# Patient Record
Sex: Male | Born: 1970 | Race: White | Hispanic: No | Marital: Single | Smoking: Current every day smoker
Health system: Southern US, Community
[De-identification: ages and names within clinical notes are randomized; demographics above are authoritative.]

## PROBLEM LIST (undated history)

## (undated) DIAGNOSIS — F329 Major depressive disorder, single episode, unspecified: Secondary | ICD-10-CM

## (undated) DIAGNOSIS — M549 Dorsalgia, unspecified: Secondary | ICD-10-CM

## (undated) DIAGNOSIS — G8929 Other chronic pain: Secondary | ICD-10-CM

## (undated) DIAGNOSIS — F1910 Other psychoactive substance abuse, uncomplicated: Secondary | ICD-10-CM

## (undated) DIAGNOSIS — F32A Depression, unspecified: Secondary | ICD-10-CM

## (undated) HISTORY — PX: CARDIAC SURGERY: SHX584

## (undated) NOTE — *Deleted (*Deleted)
ED Peer Support Specialist Patient Intake (Complete at intake & 30-60 Day Follow-up)  Name: Todd Horn  MRN: [id number]  Age: 37 y.o.   Date of Admission: 03/14/2020  Intake: Initial Comments:      Primary Reason Admitted: Drug Overdose   Lab values: Alcohol/ETOH: Not completed Positive UDS? Drug screen not completed Amphetamines: Drug screen not completed Barbiturates: Drug screen not completed Benzodiazepines: Drug screen not completed Cocaine: Drug screen not completed Opiates: Drug screen not completed Cannabinoids: Drug screen not completed  Demographic information: Gender: Male Ethnicity: Unknown Marital Status: Single Insurance Status: Uninsured/Self-pay Control and instrumentation engineer (Work Engineer, agricultural, Sales executive, etc.: No Lives with: Partner/Spouse Living situation: House/Apartment  Reported Patient History: Patient reported health conditions: Depression Patient aware of HIV and hepatitis status: No  In past year, has patient visited ED for any reason? No  Number of ED visits:    Reason(s) for visit:    In past year, has patient been hospitalized for any reason? No  Number of hospitalizations:    Reason(s) for hospitalization:    In past year, has patient been arrested? No  Number of arrests:    Reason(s) for arrest:    In past year, has patient been incarcerated? Yes  Number of incarcerations:    Reason(s) for incarceration: forgot to go to court  In past year, has patient received medication-assisted treatment? No  In past year, patient received the following treatments: Non-residential community treatment  In past year, has patient received any harm reduction services? Yes  Did this include any of the following? Purchasing syringes from a pharmacy, Making contact with an SEP  In past year, has patient received care from a mental health provider for diagnosis other than SUD? No  In past year, is this first time patient has  overdosed? No  Number of past overdoses:    In past year, is this first time patient has been hospitalized for an overdose? No  Number of hospitalizations for overdose(s):    Is patient currently receiving treatment for a mental health diagnosis? No  Patient reports experiencing difficulty participating in SUD treatment: No    Most important reason(s) for this difficulty?    Has patient received prior services for treatment? No  In past, patient has received services from following agencies:    Plan of Care:  Suggested follow up at these agencies/treatment centers: Other (comment)  Other information: ***   Merlinda Frederick, CPSS  03/14/2020 12:13 PM

---

## 2013-04-17 ENCOUNTER — Encounter (HOSPITAL_COMMUNITY): Payer: Self-pay | Admitting: Emergency Medicine

## 2013-04-17 ENCOUNTER — Emergency Department (HOSPITAL_COMMUNITY)
Admission: EM | Admit: 2013-04-17 | Discharge: 2013-04-17 | Disposition: A | Discharge status: Home / Self Care | Payer: Self-pay | Attending: Emergency Medicine | Admitting: Emergency Medicine

## 2013-04-17 DIAGNOSIS — IMO0002 Reserved for concepts with insufficient information to code with codable children: Secondary | ICD-10-CM | POA: Insufficient documentation

## 2013-04-17 DIAGNOSIS — N509 Disorder of male genital organs, unspecified: Secondary | ICD-10-CM | POA: Insufficient documentation

## 2013-04-17 DIAGNOSIS — M216X9 Other acquired deformities of unspecified foot: Secondary | ICD-10-CM | POA: Insufficient documentation

## 2013-04-17 DIAGNOSIS — G8929 Other chronic pain: Secondary | ICD-10-CM | POA: Insufficient documentation

## 2013-04-17 DIAGNOSIS — Z9889 Other specified postprocedural states: Secondary | ICD-10-CM | POA: Insufficient documentation

## 2013-04-17 DIAGNOSIS — Y929 Unspecified place or not applicable: Secondary | ICD-10-CM | POA: Insufficient documentation

## 2013-04-17 DIAGNOSIS — X500XXA Overexertion from strenuous movement or load, initial encounter: Secondary | ICD-10-CM | POA: Insufficient documentation

## 2013-04-17 DIAGNOSIS — Y9389 Activity, other specified: Secondary | ICD-10-CM | POA: Insufficient documentation

## 2013-04-17 DIAGNOSIS — M545 Low back pain: Secondary | ICD-10-CM

## 2013-04-17 HISTORY — DX: Dorsalgia, unspecified: M54.9

## 2013-04-17 HISTORY — DX: Other chronic pain: G89.29

## 2013-04-17 LAB — URINALYSIS, ROUTINE W REFLEX MICROSCOPIC
Bilirubin Urine: NEGATIVE
Glucose, UA: NEGATIVE mg/dL
Protein, ur: NEGATIVE mg/dL
Specific Gravity, Urine: 1.011 (ref 1.005–1.030)
Urobilinogen, UA: 0.2 mg/dL (ref 0.0–1.0)

## 2013-04-17 LAB — URINE MICROSCOPIC-ADD ON

## 2013-04-17 MED ORDER — KETOROLAC TROMETHAMINE 30 MG/ML IJ SOLN
30.0000 mg | Freq: Once | INTRAMUSCULAR | Status: AC
  Administered 2013-04-17: 30 mg via INTRAMUSCULAR
  Filled 2013-04-17: qty 1

## 2013-04-17 MED ORDER — HYDROCODONE-ACETAMINOPHEN 5-325 MG PO TABS: 1.0000 | ORAL_TABLET | ORAL | Status: DC | PRN

## 2013-04-17 MED ORDER — CYCLOBENZAPRINE HCL 5 MG PO TABS: 5.0000 mg | ORAL_TABLET | Freq: Three times a day (TID) | ORAL | Status: DC | PRN

## 2013-04-17 MED ORDER — DEXAMETHASONE SODIUM PHOSPHATE 10 MG/ML IJ SOLN
10.0000 mg | Freq: Once | INTRAMUSCULAR | Status: AC
  Administered 2013-04-17: 10 mg via INTRAMUSCULAR
  Filled 2013-04-17: qty 1

## 2013-04-17 NOTE — ED Notes (Signed)
Patient reports that he was lifting furniture and other things moving his parents into a new home. Patient states he has problems lifting his left foot . Patient denies any numbness.

## 2013-04-17 NOTE — ED Provider Notes (Signed)
CSN: [id number]     Arrival date & time 04/17/13  1642 History   This chart was scribed for non-physician practitioner Luiz Iron, PA-C working with Celene Kras, MD by Caryn Bee, ED Scribe. This patient was seen in room WTR6/WTR6 and the patient's care was started at 7:04 PM.    Chief Complaint  Patient presents with  . Back Pain   HPI  HPI Comments: Todd Horn is a 42 y.o. male who presents to the Emergency Department complaining of constant gradual onset left sided back pain that began 2 nights ago after moving furniture for his parents. He states that he moved the furniture and had not pain.  That night he gradually starting getting lower back pain, which was worse by the next morning.  Pt denies any back injury/trauma.  He states he has h/[place] of two back [place]out of state[place] He states that the current back pain is the same as his previous back pain. Pain is worsened when standing from sitting position. He has taken 4 800 mg ibuprofen daily and 2 narcotic pills for pain with mild relief. Pt reports numbness to his left calf. He denies loss of sensation, abdominal pain, dysuria, penile discharge, chest pain, SOB, neck pain, or fevers. Pt states that after he urinates he feels like he has finished but still has urine that drips out.  No loss of bowel/bladder function.  He describes the left testicle pain as "I feel like I've been kicked". Pt has been able to ambulate as usual. Pt denies h/o cancer. He reports an intermittent drop in his left foot since the incident.  He reports occasional tripping due to this.  He states that he had similar problems with his foot after his back surgeries in the past.      Past Medical History  Diagnosis Date  . Chronic back pain    Past Surgical History  Procedure Laterality Date  . Cardiac surgery     History reviewed. No pertinent family history. History  Substance Use Topics  . Smoking status: Never Smoker   . Smokeless tobacco:  Never Used  . Alcohol Use: Yes     Comment: occasionally    Review of Systems  Constitutional: Negative for fever, chills, diaphoresis, activity change, appetite change and fatigue.  HENT: Negative for congestion, rhinorrhea and sore throat.   Respiratory: Negative for cough and shortness of breath.   Cardiovascular: Negative for chest pain and leg swelling.  Gastrointestinal: Negative for nausea, vomiting, abdominal pain, diarrhea and constipation.  Genitourinary: Positive for testicular pain. Negative for dysuria, urgency, hematuria, discharge, penile swelling, scrotal swelling, difficulty urinating and penile pain.  Musculoskeletal: Positive for back pain. Negative for gait problem and neck pain.  Skin: Negative for color change and wound.  Neurological: Positive for numbness. Negative for dizziness, weakness, light-headedness and headaches.  All other systems reviewed and are negative.    Allergies  Review of patient's allergies indicates no known allergies.  Home Medications   Current Outpatient Rx  Name  Route  Sig  Dispense  Refill  . albuterol (PROVENTIL HFA;VENTOLIN HFA) 108 (90 BASE) MCG/ACT inhaler   Inhalation   Inhale 2 puffs into the lungs every 6 (six) hours as needed for wheezing or shortness of breath.         Marland Kitchen ibuprofen (ADVIL,MOTRIN) 200 MG tablet   Oral   Take 800 mg by mouth every 6 (six) hours as needed.  BP 159/91  Pulse 106  Temp(Src) 98.4 F (36.9 C) (Oral)  Resp 20  SpO2 98%  Filed Vitals:   04/17/13 1648 04/17/13 2038  BP: 159/91 120/77  Pulse: 106 92  Temp: 98.4 F (36.9 C) 98.9 F (37.2 C)  TempSrc: Oral Oral  Resp: 20 14  SpO2: 98% 99%     Physical Exam  Nursing note and vitals reviewed. Constitutional: He is oriented to person, place, and time. He appears well-developed and well-nourished. No distress.  HENT:  Head: Normocephalic and atraumatic.  Right Ear: External ear normal.  Left Ear: External ear normal.   Nose: Nose normal.  Mouth/Throat: Oropharynx is clear and moist. No oropharyngeal exudate.  Eyes: Conjunctivae and EOM are normal. Right eye exhibits no discharge. Left eye exhibits no discharge.  Neck: Normal range of motion. Neck supple. No tracheal deviation present.  Cardiovascular: Normal rate, regular rhythm, normal heart sounds and intact distal pulses.  Exam reveals no gallop and no friction rub.   No murmur heard. Dorsalis pedis pulses present and equal bilaterally  Pulmonary/Chest: Effort normal and breath sounds normal. No respiratory distress. He has no wheezes. He has no rales. He exhibits no tenderness.  Abdominal: Soft. Bowel sounds are normal. He exhibits no distension and no mass. There is no tenderness. There is no rebound and no guarding.  Genitourinary:  No testicular tenderness bilaterally.  No inguinal hernia bilaterally.  Musculoskeletal: Normal range of motion. He exhibits edema and tenderness.       Back:  Tenderness to palpation to the left lower lumbar region.  Pain increased with back flexion and extension. No thoracic or lumbar spinal tenderness.  Positive straight leg raise on the left.  Patient able to ambulate on his toes and heels as well as on his feet without difficulty or ataxia.  Strength 5/5 in the upper and lower extremities bilaterally.  Trace pitting edema equal bilaterally.        Neurological: He is alert and oriented to person, place, and time. He has normal reflexes.  Sensation intact in the LE bilaterally.  Patient reports focal numbness to the left lateral calf.  Patellar reflexes are intact bilaterally  Skin: Skin is warm and dry. He is not diaphoretic.  No erythema, ecchymosis or lacerations throughout  Psychiatric: He has a normal mood and affect. His behavior is normal.    ED Course  Procedures (including critical care time) DIAGNOSTIC STUDIES: Oxygen Saturation is 98% on room air, normal by my interpretation.    COORDINATION OF  CARE: 7:15 PM-Discussed treatment plan with pt at bedside and pt agreed to plan.   Labs Review Labs Reviewed - No data to display Imaging Review No results found.  EKG Interpretation   None      Results for orders placed during the hospital encounter of 04/17/13  URINE CULTURE      Result Value Range   Specimen Description URINE, CLEAN CATCH     Special Requests NONE     Culture  Setup Time       Value: 04/18/2013 00:58     Performed at [facility] Count       Value: NO GROWTH     Performed at [facility]   Culture       Value: NO GROWTH     Performed at [facility]   Report Status 04/19/2013 FINAL    URINALYSIS, ROUTINE W REFLEX MICROSCOPIC      Result Value  Range   Color, Urine YELLOW  YELLOW   APPearance CLOUDY (*) CLEAR   Specific Gravity, Urine 1.011  1.005 - 1.030   pH 6.0  5.0 - 8.0   Glucose, UA NEGATIVE  NEGATIVE mg/dL   Hgb urine dipstick NEGATIVE  NEGATIVE   Bilirubin Urine NEGATIVE  NEGATIVE   Ketones, ur NEGATIVE  NEGATIVE mg/dL   Protein, ur NEGATIVE  NEGATIVE mg/dL   Urobilinogen, UA 0.2  0.0 - 1.0 mg/dL   Nitrite NEGATIVE  NEGATIVE   Leukocytes, UA SMALL (*) NEGATIVE  URINE MICROSCOPIC-ADD ON      Result Value Range   Squamous Epithelial / LPF RARE  RARE   WBC, UA 0-2  <3 WBC/hpf    MDM   Todd Horn is a 42 y.o. male who presents to the Emergency Department complaining of constant gradual onset left sided back pain that began 2 nights ago after moving furniture for his parents   Back pain likely musculoskeletal in nature.  Possibly a muscle strain vs his chronic back pain exacerbated by moving furniture.  No injuries/trauma.  Urine not highly suggestive for a UTI.  Will be sent for culture.  Patient given Decadron and Toradol in the ED which improved his pain.  He was able to ambulate without difficulty.  Patient neurovascularly intact.  He has an appointment with a PCP on Friday.  Return precautions were  discussed. Patient given prescription for flexeril and Vicodin.  He states that Naprosyn has not worked for him in the past.  He has been using his mother's Vicodin which has been working for him.  He was given precautions regarding these medications.  Return precautions were discussed.  Patient in agreement with discharge and plan.    Discharge Medication List as of 04/17/2013  8:32 PM    START taking these medications   Details  cyclobenzaprine (FLEXERIL) 5 MG tablet Take 1 tablet (5 mg total) by mouth 3 (three) times daily as needed for muscle spasms., Starting 04/17/2013, Until Discontinued, Print    HYDROcodone-acetaminophen (NORCO/VICODIN) 5-325 MG per tablet Take 1 tablet by mouth every 4 (four) hours as needed., Starting 04/17/2013, Until Discontinued, Print        Final impressions: 1. Low back pain radiating to left leg      Luiz Iron PA-C   This patient was discussed with Dr. Lynelle Doctor   I personally performed the services described in this documentation, which was scribed in my presence. The recorded information has been reviewed and is accurate.      Jillyn Ledger, PA-C 04/19/13 1329

## 2013-04-18 ENCOUNTER — Telehealth (HOSPITAL_COMMUNITY): Payer: Self-pay | Admitting: Emergency Medicine

## 2013-04-19 LAB — URINE CULTURE

## 2013-04-24 NOTE — ED Provider Notes (Signed)
Medical screening examination/treatment/procedure(s) were performed by non-physician practitioner and as supervising physician I was immediately available for consultation/collaboration.   Adolf Ormiston R Madelynn Malson, MD 04/24/13 1027 

## 2013-05-16 ENCOUNTER — Ambulatory Visit: Payer: Self-pay | Admitting: Family Medicine

## 2013-05-16 VITALS — BP 116/82 | HR 100 | Temp 98.9°F | Resp 16 | Ht 71.0 in | Wt 231.0 lb

## 2013-05-16 DIAGNOSIS — R300 Dysuria: Secondary | ICD-10-CM

## 2013-05-16 DIAGNOSIS — M5416 Radiculopathy, lumbar region: Secondary | ICD-10-CM

## 2013-05-16 DIAGNOSIS — Z765 Malingerer [conscious simulation]: Secondary | ICD-10-CM

## 2013-05-16 DIAGNOSIS — F1120 Opioid dependence, uncomplicated: Secondary | ICD-10-CM

## 2013-05-16 DIAGNOSIS — IMO0002 Reserved for concepts with insufficient information to code with codable children: Secondary | ICD-10-CM

## 2013-05-16 DIAGNOSIS — F1910 Other psychoactive substance abuse, uncomplicated: Secondary | ICD-10-CM

## 2013-05-16 LAB — POCT URINALYSIS DIPSTICK
Bilirubin, UA: NEGATIVE
Glucose, UA: NEGATIVE
Ketones, UA: NEGATIVE
Leukocytes, UA: NEGATIVE
Nitrite, UA: NEGATIVE
PH UA: 5.5
PROTEIN UA: NEGATIVE
RBC UA: NEGATIVE
SPEC GRAV UA: 1.02
UROBILINOGEN UA: 0.2
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
## 2013-05-16 NOTE — Patient Instructions (Signed)
[facility] accepts Men (18+). [phone]  [facility] [phone]  [facility] [phone]  You can contact one of the above numbers and see if they can get you in. It is absolutely necessary that you do this.

## 2013-09-27 ENCOUNTER — Encounter (HOSPITAL_COMMUNITY): Payer: Self-pay | Admitting: Emergency Medicine

## 2013-09-27 ENCOUNTER — Emergency Department (HOSPITAL_COMMUNITY)
Admission: EM | Admit: 2013-09-27 | Discharge: 2013-09-27 | Disposition: A | Discharge status: Home / Self Care | Payer: Self-pay | Attending: Emergency Medicine | Admitting: Emergency Medicine

## 2013-09-27 DIAGNOSIS — R61 Generalized hyperhidrosis: Secondary | ICD-10-CM | POA: Insufficient documentation

## 2013-09-27 DIAGNOSIS — F1120 Opioid dependence, uncomplicated: Secondary | ICD-10-CM | POA: Insufficient documentation

## 2013-09-27 DIAGNOSIS — T4271XA Poisoning by unspecified antiepileptic and sedative-hypnotic drugs, accidental (unintentional), initial encounter: Secondary | ICD-10-CM | POA: Insufficient documentation

## 2013-09-27 DIAGNOSIS — Y9389 Activity, other specified: Secondary | ICD-10-CM | POA: Insufficient documentation

## 2013-09-27 DIAGNOSIS — F3289 Other specified depressive episodes: Secondary | ICD-10-CM | POA: Insufficient documentation

## 2013-09-27 DIAGNOSIS — Y9289 Other specified places as the place of occurrence of the external cause: Secondary | ICD-10-CM | POA: Insufficient documentation

## 2013-09-27 DIAGNOSIS — F329 Major depressive disorder, single episode, unspecified: Secondary | ICD-10-CM | POA: Insufficient documentation

## 2013-09-27 DIAGNOSIS — F17200 Nicotine dependence, unspecified, uncomplicated: Secondary | ICD-10-CM | POA: Insufficient documentation

## 2013-09-27 DIAGNOSIS — R404 Transient alteration of awareness: Secondary | ICD-10-CM | POA: Insufficient documentation

## 2013-09-27 DIAGNOSIS — R1110 Vomiting, unspecified: Secondary | ICD-10-CM | POA: Insufficient documentation

## 2013-09-27 DIAGNOSIS — G8929 Other chronic pain: Secondary | ICD-10-CM | POA: Insufficient documentation

## 2013-09-27 DIAGNOSIS — T40601A Poisoning by unspecified narcotics, accidental (unintentional), initial encounter: Secondary | ICD-10-CM

## 2013-09-27 DIAGNOSIS — Z79899 Other long term (current) drug therapy: Secondary | ICD-10-CM | POA: Insufficient documentation

## 2013-09-27 HISTORY — DX: Depression, unspecified: F32.A

## 2013-09-27 HISTORY — DX: Other psychoactive substance abuse, uncomplicated: F19.10

## 2013-09-27 HISTORY — DX: Major depressive disorder, single episode, unspecified: F32.9

## 2013-09-27 LAB — CBC WITH DIFFERENTIAL/PLATELET
Basophils Absolute: 0 10*3/uL (ref 0.0–0.1)
Basophils Relative: 0 % (ref 0–1)
EOS ABS: 0.1 10*3/uL (ref 0.0–0.7)
EOS PCT: 1 % (ref 0–5)
HCT: 41.3 % (ref 39.0–52.0)
Hemoglobin: 14.4 g/dL (ref 13.0–17.0)
Lymphocytes Relative: 17 % (ref 12–46)
Lymphs Abs: 1.9 10*3/uL (ref 0.7–4.0)
MCH: 30.5 pg (ref 26.0–34.0)
MCHC: 34.9 g/dL (ref 30.0–36.0)
MCV: 87.5 fL (ref 78.0–100.0)
Monocytes Absolute: 0.7 10*3/uL (ref 0.1–1.0)
Monocytes Relative: 6 % (ref 3–12)
Neutro Abs: 9 10*3/uL — ABNORMAL HIGH (ref 1.7–7.7)
Neutrophils Relative %: 76 % (ref 43–77)
PLATELETS: 244 10*3/uL (ref 150–400)
RBC: 4.72 MIL/uL (ref 4.22–5.81)
RDW: 12.6 % (ref 11.5–15.5)
WBC: 11.8 10*3/uL — ABNORMAL HIGH (ref 4.0–10.5)

## 2013-09-27 LAB — RAPID URINE DRUG SCREEN, HOSP PERFORMED
AMPHETAMINES: POSITIVE — AB
BENZODIAZEPINES: NOT DETECTED
Barbiturates: NOT DETECTED
COCAINE: NOT DETECTED
Opiates: POSITIVE — AB
Tetrahydrocannabinol: NOT DETECTED

## 2013-09-27 LAB — COMPREHENSIVE METABOLIC PANEL
ALT: 39 U/L (ref 0–53)
AST: 35 U/L (ref 0–37)
Albumin: 4.4 g/dL (ref 3.5–5.2)
Alkaline Phosphatase: 90 U/L (ref 39–117)
BUN: 17 mg/dL (ref 6–23)
CALCIUM: 9.5 mg/dL (ref 8.4–10.5)
CO2: 25 mEq/L (ref 19–32)
CREATININE: 1.14 mg/dL (ref 0.50–1.35)
Chloride: 98 mEq/L (ref 96–112)
GFR calc non Af Amer: 77 mL/min — ABNORMAL LOW (ref >90)
GFR, EST AFRICAN AMERICAN: 89 mL/min — AB (ref >90)
GLUCOSE: 186 mg/dL — AB (ref 70–99)
Potassium: 4 mEq/L (ref 3.7–5.3)
SODIUM: 138 meq/L (ref 137–147)
Total Bilirubin: 0.3 mg/dL (ref 0.3–1.2)
Total Protein: 7.6 g/dL (ref 6.0–8.3)

## 2013-09-27 LAB — ETHANOL: Alcohol, Ethyl (B): <11 mg/dL (ref 0–11)

## 2013-09-27 MED ORDER — ONDANSETRON HCL 4 MG/2ML IJ SOLN
4.0000 mg | Freq: Once | INTRAMUSCULAR | Status: AC
  Administered 2013-09-27: 4 mg via INTRAVENOUS
  Filled 2013-09-27: qty 2

## 2013-09-27 NOTE — Progress Notes (Signed)
[facility] provided pt with a list of primary care resources, ACA information, and a [facility] Orange Card application to help patient establish primary care.  °

## 2013-09-27 NOTE — ED Notes (Signed)
Per EMS- Patient had been clean since 2010. Patient lost his job 4 days ago and used heroin IV today at home. Patient was unresponsive upon arrival. Narcan 2 mg given intranasally. Patient now alert and oriented x 4. Patient c/o slight nausea.

## 2013-09-27 NOTE — Discharge Instructions (Signed)
Narcotic Overdose  A narcotic overdose is the misuse or overuse of a narcotic drug. A narcotic overdose can make you pass out and stop breathing. If you are not treated right away, this can cause permanent brain damage or stop your heart. Medicine may be given to reverse the effects of an overdose. If so, this medicine may bring on withdrawal symptoms. The symptoms may be abdominal cramps, throwing up (vomiting), sweating, chills, and nervousness.  Injecting narcotics can cause more problems than just an overdose. AIDS, hepatitis, and other very serious infections are transmitted by sharing needles and syringes. If you decide to quit using, there are medicines which can help you through the withdrawal period. Trying to quit all at once on your own can be uncomfortable, but not life-threatening. Call your caregiver, Narcotics Anonymous, or any drug and alcohol treatment program for further help.   Document Released: 06/05/2004 Document Revised: 07/21/2011 Document Reviewed: 03/30/2009  ExitCare Patient Information 2014 [employer].
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
## 2013-09-27 NOTE — ED Notes (Signed)
John sent the urine down

## 2013-09-27 NOTE — ED Notes (Signed)
Bed: WA17 Expected date:  Expected time:  Means of arrival:  Comments: EMS- heroin OD 

## 2013-09-27 NOTE — ED Notes (Signed)
Asked pt if he is able to give urine specimen, pt stated he just had a cup of water and should be able to do so shortly.  Pt did not need another cup of water when asked?

## 2013-11-10 ENCOUNTER — Emergency Department (HOSPITAL_COMMUNITY)
Admission: EM | Admit: 2013-11-10 | Discharge: 2013-11-10 | Disposition: A | Discharge status: Home / Self Care | Payer: Self-pay | Attending: Emergency Medicine | Admitting: Emergency Medicine

## 2013-11-10 ENCOUNTER — Encounter (HOSPITAL_COMMUNITY): Payer: Self-pay | Admitting: Emergency Medicine

## 2013-11-10 DIAGNOSIS — F17200 Nicotine dependence, unspecified, uncomplicated: Secondary | ICD-10-CM | POA: Insufficient documentation

## 2013-11-10 DIAGNOSIS — G8929 Other chronic pain: Secondary | ICD-10-CM | POA: Insufficient documentation

## 2013-11-10 DIAGNOSIS — F329 Major depressive disorder, single episode, unspecified: Secondary | ICD-10-CM | POA: Insufficient documentation

## 2013-11-10 DIAGNOSIS — M549 Dorsalgia, unspecified: Secondary | ICD-10-CM | POA: Insufficient documentation

## 2013-11-10 DIAGNOSIS — F1910 Other psychoactive substance abuse, uncomplicated: Secondary | ICD-10-CM | POA: Insufficient documentation

## 2013-11-10 DIAGNOSIS — F3289 Other specified depressive episodes: Secondary | ICD-10-CM | POA: Insufficient documentation

## 2013-11-10 DIAGNOSIS — Z91018 Allergy to other foods: Secondary | ICD-10-CM | POA: Insufficient documentation

## 2013-11-10 MED ORDER — NAPROXEN 500 MG PO TABS: 500.0000 mg | ORAL_TABLET | Freq: Two times a day (BID) | ORAL | Status: DC

## 2013-11-10 MED ORDER — METHOCARBAMOL 500 MG PO TABS: 500.0000 mg | ORAL_TABLET | Freq: Two times a day (BID) | ORAL | Status: DC

## 2013-11-10 NOTE — ED Provider Notes (Signed)
Medical screening examination/treatment/procedure(s) were performed by non-physician practitioner and as supervising physician I was immediately available for consultation/collaboration.   EKG Interpretation None        Mung Rinker, MD 11/10/13 2324 

## 2013-11-10 NOTE — ED Notes (Signed)
Pt. reports mid back pain onset yesterday while " stretching " on bed , denies fall /injury or strenuous activity , ambulatory ,no urinary symptoms .

## 2013-11-10 NOTE — Discharge Instructions (Signed)
Take the prescribed medication as directed.  Continue applying heat to affected areas for added relief. Follow-up with your primary care physician. Return to the ED for new or worsening symptoms.

## 2013-11-10 NOTE — ED Notes (Signed)
Pt here for c/o mid back pain after stretching and pulling something. No deformity present.

## 2013-11-10 NOTE — ED Provider Notes (Signed)
CSN: [id number]     Arrival date & time 11/10/13  2008 History  This chart was scribed for non-physician practitioner, Garlon HatchetLisa M Darreld Hoffer, PA-C, working with Rolan BuccoMelanie Belfi, MD, by Bronson CurbJacqueline Melvin, ED Scribe. This patient was seen in room TR07C/TR07C and the patient's care was started at 8:23 PM.     Chief Complaint  Patient presents with  . Back Pain     The history is provided by the patient. No language interpreter was used.    HPI Comments: Todd Horn is a 43 y.o. male who with history of chronic back pain and depression presents to the Emergency Department complaining of upper left back pain onset yesterday. Patient states after waking up, he attempted to stretched, when he felt something "pull" in his back. Patient states the pain radiates to his left shoulder and left arm. He reports the pain is worsened when he attempts to turn his head to the left, and is relieved upon holding his head down. He denies fever, chills, numbness, weakness, or paresthesias of extremities. Patient has history of 2 lower back surgeries. Patient also has history of substance abuse (heroine) but denies recent use.  Has been applying warm compresses and ice packs without noted improvement.  Past Medical History  Diagnosis Date  . Chronic back pain   . Substance abuse     heroin   . Depression    Past Surgical History  Procedure Laterality Date  . Cardiac surgery     Family History  Problem Relation Age of Onset  . Hypertension Mother    History  Substance Use Topics  . Smoking status: Current Every Day Smoker -- 0.50 packs/day    Types: Cigarettes  . Smokeless tobacco: Never Used  . Alcohol Use: Yes     Comment: rare    Review of Systems  Constitutional: Negative for fever and chills.  Musculoskeletal: Positive for back pain.  All other systems reviewed and are negative.     Allergies  Fish allergy  Home Medications   Prior to Admission medications   Medication Sig Start Date End  Date Taking? Authorizing Provider  albuterol (PROVENTIL HFA;VENTOLIN HFA) 108 (90 BASE) MCG/ACT inhaler Inhale 2 puffs into the lungs every 6 (six) hours as needed for wheezing or shortness of breath.    Historical Provider, MD  HYDROcodone-acetaminophen (NORCO) 10-325 MG per tablet Take 2 tablets by mouth every 6 (six) hours as needed.    Historical Provider, MD  HYDROmorphone (DILAUDID) 4 MG tablet Take by mouth every 4 (four) hours as needed for severe pain.    Historical Provider, MD  ibuprofen (ADVIL,MOTRIN) 200 MG tablet Take 800 mg by mouth every 6 (six) hours as needed.    Historical Provider, MD   Triage Vitals: BP 159/86  Pulse 96  Temp(Src) 98.6 F (37 C) (Oral)  Resp 14  Ht 6' (1.829 m)  Wt 240 lb (108.863 kg)  BMI 32.54 kg/m2  SpO2 98%  Physical Exam  Nursing note and vitals reviewed. Constitutional: He is oriented to person, place, and time. He appears well-developed and well-nourished. No distress.  HENT:  Head: Normocephalic and atraumatic.  Mouth/Throat: Oropharynx is clear and moist.  Eyes: Conjunctivae and EOM are normal. Pupils are equal, round, and reactive to light.  Neck: Normal range of motion. Neck supple.  Cardiovascular: Normal rate, regular rhythm and normal heart sounds.   Pulmonary/Chest: Effort normal and breath sounds normal. No respiratory distress. He has no wheezes.  Musculoskeletal: Normal range of motion.  Cervical back: He exhibits tenderness, pain and spasm.       Back:  Pain and tenderness of left upper shoulder blade and trapezius region with muscle spasm present; no midline tenderness, bony deformities, or step off; overlying skin without erythema or signs of infection; full range of motion of neck and bilateral shoulders Normal strength and sensation of all 4 extremities, no streaking up arms or legs; pulses intact x4  Neurological: He is alert and oriented to person, place, and time. He has normal strength. He displays no tremor. No  cranial nerve deficit or sensory deficit. He displays no seizure activity. Gait normal.  Skin: Skin is warm and dry. He is not diaphoretic.  Psychiatric: He has a normal mood and affect.    ED Course  Procedures (including critical care time)  DIAGNOSTIC STUDIES: Oxygen Saturation is 98% on room air, normal by my interpretation.    COORDINATION OF CARE: At 2030 Discussed treatment plan with patient which includes Naprosyn and Robaxin. Patient agrees.   Labs Review Labs Reviewed - No data to display  Imaging Review No results found.   EKG Interpretation None      MDM   Final diagnoses:  Left-sided back pain, unspecified location   Atraumatic pain of left upper shoulder blade and left trapezius with muscle spasm present.  No midline tenderness, step-off, or deformities. All 4 extremities remained neurovascularly intact with normal strength. Patient has no red flag symptoms concerning for central cord syndrome, cauda equina, or other neurovascular compromise.  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  Patient started on Robaxin and Naprosyn. He will follow with his primary care physician.  Discussed plan with patient, he/she acknowledged understanding and agreed with plan of care.  Return precautions given for new or worsening symptoms.  I personally performed the services described in this documentation, which was scribed in my presence. The recorded information has been reviewed and is accurate.  Garlon HatchetLisa M Kayse Puccini, PA-C 11/10/13 2137

## 2013-11-11 ENCOUNTER — Emergency Department (HOSPITAL_COMMUNITY)
Admission: EM | Admit: 2013-11-11 | Discharge: 2013-11-11 | Disposition: A | Discharge status: Home / Self Care | Payer: Self-pay | Attending: Emergency Medicine | Admitting: Emergency Medicine

## 2013-11-11 ENCOUNTER — Encounter (HOSPITAL_COMMUNITY): Payer: Self-pay | Admitting: Emergency Medicine

## 2013-11-11 DIAGNOSIS — M5412 Radiculopathy, cervical region: Secondary | ICD-10-CM

## 2013-11-11 DIAGNOSIS — Y929 Unspecified place or not applicable: Secondary | ICD-10-CM | POA: Insufficient documentation

## 2013-11-11 DIAGNOSIS — Z9861 Coronary angioplasty status: Secondary | ICD-10-CM | POA: Insufficient documentation

## 2013-11-11 DIAGNOSIS — F17200 Nicotine dependence, unspecified, uncomplicated: Secondary | ICD-10-CM | POA: Insufficient documentation

## 2013-11-11 DIAGNOSIS — G8929 Other chronic pain: Secondary | ICD-10-CM | POA: Insufficient documentation

## 2013-11-11 DIAGNOSIS — S46812D Strain of other muscles, fascia and tendons at shoulder and upper arm level, left arm, subsequent encounter: Secondary | ICD-10-CM

## 2013-11-11 DIAGNOSIS — Z79899 Other long term (current) drug therapy: Secondary | ICD-10-CM | POA: Insufficient documentation

## 2013-11-11 DIAGNOSIS — S43499A Other sprain of unspecified shoulder joint, initial encounter: Secondary | ICD-10-CM | POA: Insufficient documentation

## 2013-11-11 DIAGNOSIS — S46819A Strain of other muscles, fascia and tendons at shoulder and upper arm level, unspecified arm, initial encounter: Secondary | ICD-10-CM

## 2013-11-11 DIAGNOSIS — Z8659 Personal history of other mental and behavioral disorders: Secondary | ICD-10-CM | POA: Insufficient documentation

## 2013-11-11 DIAGNOSIS — X58XXXA Exposure to other specified factors, initial encounter: Secondary | ICD-10-CM | POA: Insufficient documentation

## 2013-11-11 DIAGNOSIS — Y939 Activity, unspecified: Secondary | ICD-10-CM | POA: Insufficient documentation

## 2013-11-11 DIAGNOSIS — Z791 Long term (current) use of non-steroidal anti-inflammatories (NSAID): Secondary | ICD-10-CM | POA: Insufficient documentation

## 2013-11-11 MED ORDER — CYCLOBENZAPRINE HCL 10 MG PO TABS: 10.0000 mg | ORAL_TABLET | Freq: Three times a day (TID) | ORAL | Status: DC | PRN

## 2013-11-11 NOTE — Discharge Instructions (Signed)

## 2013-11-11 NOTE — ED Provider Notes (Signed)
CSN: [id number]     Arrival date & time 11/11/13  0705 History   None    Chief Complaint  Patient presents with  . Back Pain     (Consider location/radiation/quality/duration/timing/severity/associated sxs/prior Treatment) Patient is a 43 y.o. male presenting with back pain. The history is provided by the patient.  Back Pain Associated symptoms: no abdominal pain, no chest pain, no dysuria, no fever, no headaches, no numbness and no weakness   his pain started 2 days ago after he woke up from sleep and felt pain on his neck and upper back. He tried to do stretching and felt something was pulled on the left shoulder bladder area. He came to the ED yesterday because of the pain. He was discharged with robaxin and naprosyn for his pain which he took yesterday, along with 800mg  ibuprofen without any relief. Icing also did not help. He currently has 7/10 pain on his left shoulder blade area, sharp shooting pain that goes down to his bicep, worsens with standing up or extending his head back, or moving his left arm. Right side is without pain. Denies any loss of strength or numbness. Had some tingling on his left hand last night. He recently started going to the gym but does not recall doing any unusual movements at the gym.  He has hx of previous lower back pain from accident and had back surgery a long time ago (1997). He takes hydrocodone at home as needed when pain is severe. He was planning to see his PCP for the pain but could not make appointment until this coming Friday.  Past Medical History  Diagnosis Date  . Chronic back pain   . Substance abuse     heroin   . Depression    Past Surgical History  Procedure Laterality Date  . Cardiac surgery     Family History  Problem Relation Age of Onset  . Hypertension Mother    History  Substance Use Topics  . Smoking status: Current Every Day Smoker -- 0.50 packs/day    Types: Cigarettes  . Smokeless tobacco: Never Used  . Alcohol Use:  Yes     Comment: rare    Review of Systems  Constitutional: Negative for fever, chills, diaphoresis and fatigue.  HENT: Negative for congestion, ear discharge, ear pain, facial swelling, hearing loss, rhinorrhea and sinus pressure.   Eyes: Negative for pain, discharge, redness and visual disturbance.  Respiratory: Negative for apnea, cough, chest tightness, shortness of breath and wheezing.   Cardiovascular: Negative for chest pain and palpitations.  Gastrointestinal: Negative for nausea, vomiting, abdominal pain, diarrhea, constipation and abdominal distention.  Endocrine: Negative for cold intolerance, heat intolerance and polyuria.  Genitourinary: Negative for dysuria, urgency, frequency, hematuria, flank pain and difficulty urinating.  Musculoskeletal: Positive for back pain and neck pain.  Neurological: Negative for dizziness, tremors, seizures, syncope, facial asymmetry, speech difficulty, weakness, light-headedness, numbness and headaches.      Allergies  Fish allergy  Home Medications   Prior to Admission medications   Medication Sig Start Date End Date Taking? Authorizing Provider  albuterol (PROVENTIL HFA;VENTOLIN HFA) 108 (90 BASE) MCG/ACT inhaler Inhale 2 puffs into the lungs every 6 (six) hours as needed for wheezing or shortness of breath.   Yes Historical Provider, MD  HYDROcodone-acetaminophen (NORCO) 10-325 MG per tablet Take 2 tablets by mouth every 6 (six) hours as needed.   Yes Historical Provider, MD  ibuprofen (ADVIL,MOTRIN) 200 MG tablet Take 800 mg by mouth every 6 (  six) hours as needed.   Yes Historical Provider, MD  methocarbamol (ROBAXIN) 500 MG tablet Take 1 tablet (500 mg total) by mouth 2 (two) times daily. 11/10/13  Yes Garlon HatchetLisa M Sanders, PA-C  naproxen (NAPROSYN) 500 MG tablet Take 1 tablet (500 mg total) by mouth 2 (two) times daily with a meal. 11/10/13  Yes Garlon HatchetLisa M Sanders, PA-C  cyclobenzaprine (FLEXERIL) 10 MG tablet Take 1 tablet (10 mg total) by mouth 3  (three) times daily as needed for muscle spasms. 11/11/13   Sadeel Fiddler, MD   BP 188/100  Pulse 88  Temp(Src) 98.8 F (37.1 C) (Oral)  Resp 22  Ht 6' (1.829 m)  Wt 245 lb (111.131 kg)  BMI 33.22 kg/m2  SpO2 97% Physical Exam  Constitutional: He is oriented to person, place, and time. He appears well-developed and well-nourished.  HENT:  Head: Normocephalic.  Eyes: Conjunctivae and EOM are normal. Pupils are equal, round, and reactive to light.  Neck: Normal range of motion.  Cardiovascular: Normal rate.  Exam reveals no gallop and no friction rub.   No murmur heard. Pulmonary/Chest: No respiratory distress. He has no wheezes. He has no rales. He exhibits no tenderness.  Abdominal: He exhibits no distension and no mass. There is no tenderness. There is no rebound and no guarding.  Musculoskeletal:       Right shoulder: Normal.       Left shoulder: He exhibits decreased range of motion. He exhibits no tenderness, no bony tenderness, no swelling, no effusion, no crepitus, no deformity, no laceration and normal strength.       Right elbow: Normal.      Left elbow: Normal.       Right wrist: Normal.       Left wrist: Normal.       Right hip: Normal.       Left hip: Normal.       Right knee: Normal.       Left knee: Normal.       Cervical back: He exhibits tenderness.       Arms:      Right upper leg: Normal.       Left upper leg: Normal.  Neurological: He is alert and oriented to person, place, and time. He displays normal reflexes. No cranial nerve deficit. He exhibits normal muscle tone. Coordination normal.  Skin: Skin is warm. No rash noted. No erythema. No pallor.  Psychiatric: He has a normal mood and affect.    ED Course  Procedures (including critical care time) Labs Review Labs Reviewed - No data to display  Imaging Review No results found.   EKG Interpretation None      MDM   Final diagnoses:  Trapezius muscle strain, left, subsequent encounter  Cervical  radiculopathy   His symptoms are likely most likely due to left trapezius strain or cervical radiculopathy or spinal epidural abscess. Was seen at ED yesterday but mentioned that the meds (robaxin and naprosyn) did not help.   Would recommend continuing NSAIDS, icing, and rest. Would also recommend flexiril. Follow up with PCP for further pain treatment as needed.   Hyacinth Meekerasrif Keyan Folson, MD 11/11/13 [phone]

## 2013-11-11 NOTE — ED Notes (Signed)
Pt reports mid upper back pain that started Wednesday, was stretching when he woke up and flexed his muscles and then felt something strange. Was seen here last night for same and told it was a pulled muscle, prescribed him Naproxen and Robaxin but patient reports this has not helped and pain is increasing and spreading down into left bicep are "only when I turn my head to the left." A&OX4.

## 2013-11-11 NOTE — ED Provider Notes (Signed)
I saw and evaluated the patient, reviewed the resident's note and I agree with the findings and plan.   EKG Interpretation None      43yM with L neck/shoulder pain. No trauma. Woke up from sleep with it two days ago. I could not reproduce pain on exam. He is neurovascularly intact. No concerning skin lesions. Does report occasionally radiates into L hand. May have cervical radiculopathy. Hx of drug abuse, but with 2 days of symptoms w/o other concerning features I have a low suspicion for SEA/vertebral osteomyelitis at this time.   Pt has his history of substance abuse. Seen in ED 6 weeks ago after overdose and required naloxone. Per review of [place] Controlled Substance Database, since the new year he has received prescriptions from four different providers for opiates. Requesting prescription for narcotic until can see PCP next Friday. I do not feel comfortable with this. PRN NSAIDs. Doesn't think robaxin is doing anything. Will try flexeril.   Raeford RazorStephen Akima Slaugh, MD 11/11/13 [phone]

## 2013-11-13 NOTE — ED Provider Notes (Signed)
I saw and evaluated the patient, reviewed the resident's note and I agree with the findings and plan.   EKG Interpretation None      See other note.   Raeford RazorStephen Annis Lagoy, MD 11/13/13 1249

## 2014-06-28 ENCOUNTER — Other Ambulatory Visit: Payer: Self-pay | Admitting: Cardiology

## 2014-06-28 ENCOUNTER — Ambulatory Visit
Admission: RE | Admit: 2014-06-28 | Discharge: 2014-06-28 | Disposition: A | Discharge status: Home / Self Care | Payer: BLUE CROSS/BLUE SHIELD | Source: Ambulatory Visit | Attending: Cardiology | Admitting: Cardiology

## 2014-06-28 DIAGNOSIS — R0789 Other chest pain: Secondary | ICD-10-CM

## 2014-10-03 ENCOUNTER — Other Ambulatory Visit (HOSPITAL_COMMUNITY): Payer: Self-pay | Admitting: Family Medicine

## 2014-10-03 DIAGNOSIS — R609 Edema, unspecified: Secondary | ICD-10-CM

## 2014-10-04 ENCOUNTER — Ambulatory Visit (HOSPITAL_COMMUNITY): Payer: Self-pay

## 2014-10-16 ENCOUNTER — Ambulatory Visit (HOSPITAL_COMMUNITY)
Admission: RE | Admit: 2014-10-16 | Discharge: 2014-10-16 | Disposition: A | Discharge status: Home / Self Care | Payer: BLUE CROSS/BLUE SHIELD | Source: Ambulatory Visit | Attending: Family Medicine | Admitting: Family Medicine

## 2014-10-16 DIAGNOSIS — R609 Edema, unspecified: Secondary | ICD-10-CM

## 2014-10-16 DIAGNOSIS — R590 Localized enlarged lymph nodes: Secondary | ICD-10-CM | POA: Insufficient documentation

## 2014-10-16 DIAGNOSIS — M7989 Other specified soft tissue disorders: Secondary | ICD-10-CM | POA: Insufficient documentation

## 2014-10-16 NOTE — Progress Notes (Signed)
Bilateral lower extremity venous duplex completed:  No evidence of DVT, superficial thrombosis, or Baker's cyst.   

## 2015-05-29 ENCOUNTER — Other Ambulatory Visit: Payer: Self-pay

## 2015-05-29 ENCOUNTER — Emergency Department (HOSPITAL_COMMUNITY): Payer: Self-pay

## 2015-05-29 ENCOUNTER — Encounter (HOSPITAL_COMMUNITY): Payer: Self-pay

## 2015-05-29 ENCOUNTER — Emergency Department (HOSPITAL_COMMUNITY)
Admission: EM | Admit: 2015-05-29 | Discharge: 2015-05-29 | Disposition: A | Discharge status: Home / Self Care | Payer: Self-pay | Attending: Emergency Medicine | Admitting: Emergency Medicine

## 2015-05-29 DIAGNOSIS — M542 Cervicalgia: Secondary | ICD-10-CM | POA: Insufficient documentation

## 2015-05-29 DIAGNOSIS — M25512 Pain in left shoulder: Secondary | ICD-10-CM

## 2015-05-29 DIAGNOSIS — F17210 Nicotine dependence, cigarettes, uncomplicated: Secondary | ICD-10-CM | POA: Insufficient documentation

## 2015-05-29 DIAGNOSIS — G8929 Other chronic pain: Secondary | ICD-10-CM | POA: Insufficient documentation

## 2015-05-29 DIAGNOSIS — M79622 Pain in left upper arm: Secondary | ICD-10-CM | POA: Insufficient documentation

## 2015-05-29 DIAGNOSIS — Z79891 Long term (current) use of opiate analgesic: Secondary | ICD-10-CM | POA: Insufficient documentation

## 2015-05-29 DIAGNOSIS — R21 Rash and other nonspecific skin eruption: Secondary | ICD-10-CM | POA: Insufficient documentation

## 2015-05-29 DIAGNOSIS — Z8659 Personal history of other mental and behavioral disorders: Secondary | ICD-10-CM | POA: Insufficient documentation

## 2015-05-29 DIAGNOSIS — Z9889 Other specified postprocedural states: Secondary | ICD-10-CM | POA: Insufficient documentation

## 2015-05-29 LAB — BASIC METABOLIC PANEL
ANION GAP: 11 (ref 5–15)
BUN: 9 mg/dL (ref 6–20)
CALCIUM: 9.5 mg/dL (ref 8.9–10.3)
CO2: 24 mmol/L (ref 22–32)
Chloride: 100 mmol/L — ABNORMAL LOW (ref 101–111)
Creatinine, Ser: 0.78 mg/dL (ref 0.61–1.24)
GFR calc non Af Amer: >60 mL/min (ref >60)
Glucose, Bld: 113 mg/dL — ABNORMAL HIGH (ref 65–99)
Potassium: 4.2 mmol/L (ref 3.5–5.1)
SODIUM: 135 mmol/L (ref 135–145)

## 2015-05-29 LAB — CBC
HCT: 44.8 % (ref 39.0–52.0)
HEMOGLOBIN: 15.4 g/dL (ref 13.0–17.0)
MCH: 30.9 pg (ref 26.0–34.0)
MCHC: 34.4 g/dL (ref 30.0–36.0)
MCV: 89.8 fL (ref 78.0–100.0)
PLATELETS: 218 10*3/uL (ref 150–400)
RBC: 4.99 MIL/uL (ref 4.22–5.81)
RDW: 12.6 % (ref 11.5–15.5)
WBC: 7.8 10*3/uL (ref 4.0–10.5)

## 2015-05-29 LAB — I-STAT TROPONIN, ED: Troponin i, poc: 0 ng/mL (ref 0.00–0.08)

## 2015-05-29 MED ORDER — KETOROLAC TROMETHAMINE 60 MG/2ML IM SOLN
60.0000 mg | Freq: Once | INTRAMUSCULAR | Status: AC
  Administered 2015-05-29: 60 mg via INTRAMUSCULAR
  Filled 2015-05-29: qty 2

## 2015-05-29 MED ORDER — DEXAMETHASONE SODIUM PHOSPHATE 10 MG/ML IJ SOLN
10.0000 mg | Freq: Once | INTRAMUSCULAR | Status: AC
  Administered 2015-05-29: 10 mg via INTRAMUSCULAR
  Filled 2015-05-29: qty 1

## 2015-05-29 MED ORDER — TRAMADOL HCL 50 MG PO TABS: 50.0000 mg | ORAL_TABLET | Freq: Four times a day (QID) | ORAL | Status: DC | PRN

## 2015-05-29 MED ORDER — DIAZEPAM 5 MG PO TABS
5.0000 mg | ORAL_TABLET | Freq: Once | ORAL | Status: AC
  Administered 2015-05-29: 5 mg via ORAL
  Filled 2015-05-29: qty 1

## 2015-05-29 NOTE — ED Notes (Signed)
Patient here with 1 week of left anterior CP with left arm pain, patient reports that the pain is worse with inspiration and movement. Started after shoveling snow

## 2015-05-29 NOTE — Discharge Instructions (Signed)

## 2015-05-29 NOTE — ED Provider Notes (Addendum)
CSN: [id number]     Arrival date & time 05/29/15  1201 History   First MD Initiated Contact with Patient 05/29/15 1554     Chief Complaint  Patient presents with  . Chest Pain     (Consider location/radiation/quality/duration/timing/severity/associated sxs/prior Treatment) HPI Comments: Patient with a history of chronic back pain and prior heroin abuse presents with left shoulder pain. He states he was shoveling snow and ice last week. He about 2 days after that he developed pain in his left shoulder. He states it feels better if he keeps his arm in an elevated position. Hurts with movement. It hurts with breathing. He states the pain that starts in the shoulder radiates down his arm and up toward his neck and across to his chest. He denies any numbness or weakness in the arm. He also noticed a rash this started about 3 days ago as well to his arm. He states it's itchy but not painful.  Patient is a 45 y.o. male presenting with chest pain.  Chest Pain Associated symptoms: no abdominal pain, no back pain, no cough, no diaphoresis, no dizziness, no fatigue, no fever, no headache, no nausea, no numbness, no shortness of breath, not vomiting and no weakness     Past Medical History  Diagnosis Date  . Chronic back pain   . Substance abuse     heroin   . Depression    Past Surgical History  Procedure Laterality Date  . Cardiac surgery     Family History  Problem Relation Age of Onset  . Hypertension Mother    Social History  Substance Use Topics  . Smoking status: Current Every Day Smoker -- 0.50 packs/day    Types: Cigarettes  . Smokeless tobacco: Never Used  . Alcohol Use: Yes     Comment: rare    Review of Systems  Constitutional: Negative for fever, chills, diaphoresis and fatigue.  HENT: Negative for congestion, rhinorrhea and sneezing.   Eyes: Negative.   Respiratory: Negative for cough, chest tightness and shortness of breath.   Cardiovascular: Positive for chest pain.  Negative for leg swelling.  Gastrointestinal: Negative for nausea, vomiting, abdominal pain, diarrhea and blood in stool.  Genitourinary: Negative for frequency, hematuria, flank pain and difficulty urinating.  Musculoskeletal: Positive for myalgias, arthralgias and neck pain. Negative for back pain.  Skin: Positive for rash.  Neurological: Negative for dizziness, speech difficulty, weakness, numbness and headaches.      Allergies  Fish allergy  Home Medications   Prior to Admission medications   Medication Sig Start Date End Date Taking? Authorizing Provider  albuterol (PROVENTIL HFA;VENTOLIN HFA) 108 (90 BASE) MCG/ACT inhaler Inhale 2 puffs into the lungs every 6 (six) hours as needed for wheezing or shortness of breath.   Yes Historical Provider, MD  ibuprofen (ADVIL,MOTRIN) 200 MG tablet Take 200 mg by mouth every 6 (six) hours as needed for mild pain or moderate pain.    Yes Historical Provider, MD  methadone (DOLOPHINE) 10 MG/ML solution Take 50 mg by mouth daily.   Yes Historical Provider, MD  cyclobenzaprine (FLEXERIL) 10 MG tablet Take 1 tablet (10 mg total) by mouth 3 (three) times daily as needed for muscle spasms. Patient not taking: Reported on 05/29/2015 11/11/13   Hyacinth Meeker, MD  methocarbamol (ROBAXIN) 500 MG tablet Take 1 tablet (500 mg total) by mouth 2 (two) times daily. Patient not taking: Reported on 05/29/2015 11/10/13   Garlon Hatchet, PA-C  naproxen (NAPROSYN) 500 MG tablet Take  1 tablet (500 mg total) by mouth 2 (two) times daily with a meal. Patient not taking: Reported on 05/29/2015 11/10/13   Garlon Hatchet, PA-C  traMADol (ULTRAM) 50 MG tablet Take 1 tablet (50 mg total) by mouth every 6 (six) hours as needed. 05/29/15   Rolan Bucco, MD   BP 185/95 mmHg  Pulse 84  Temp(Src) 98.6 F (37 C) (Oral)  Resp 13  Ht 6' (1.829 m)  Wt 240 lb (108.863 kg)  BMI 32.54 kg/m2  SpO2 99% Physical Exam  Constitutional: He is oriented to person, place, and time. He appears  well-developed and well-nourished.  HENT:  Head: Normocephalic and atraumatic.  Eyes: Pupils are equal, round, and reactive to light.  Neck: Normal range of motion. Neck supple.  Tenderness along the left paraspinal muscles.  Cardiovascular: Normal rate, regular rhythm and normal heart sounds.   Pulmonary/Chest: Effort normal and breath sounds normal. No respiratory distress. He has no wheezes. He has no rales. He exhibits no tenderness.  Abdominal: Soft. Bowel sounds are normal. There is no tenderness. There is no rebound and no guarding.  Musculoskeletal: Normal range of motion. He exhibits no edema.  Tenderness to left shoulder, mainly at [facility] joint.  Tenderness to the musculature of the left upper arm and along the trapezius muscle. There some tenderness to the anterior chest wall adjacent to the shoulder. He has normal sensation and motor function in the hand. Pedal pulses are intact. He does have a vesicular rash noted over the left lower biceps area and patches to his left upper arm as well.  Lymphadenopathy:    He has no cervical adenopathy.  Neurological: He is alert and oriented to person, place, and time.  Skin: Skin is warm and dry. No rash noted.  Psychiatric: He has a normal mood and affect.    ED Course  Procedures (including critical care time) Labs Review Results for orders placed or performed during the hospital encounter of 05/29/15  Basic metabolic panel  Result Value Ref Range   Sodium 135 135 - 145 mmol/L   Potassium 4.2 3.5 - 5.1 mmol/L   Chloride 100 (L) 101 - 111 mmol/L   CO2 24 22 - 32 mmol/L   Glucose, Bld 113 (H) 65 - 99 mg/dL   BUN 9 6 - 20 mg/dL   Creatinine, Ser 1.61 0.61 - 1.24 mg/dL   Calcium 9.5 8.9 - 09.6 mg/dL   GFR calc non Af Amer >60 >60 mL/min   GFR calc Af Amer >60 >60 mL/min   Anion gap 11 5 - 15  CBC  Result Value Ref Range   WBC 7.8 4.0 - 10.5 K/uL   RBC 4.99 4.22 - 5.81 MIL/uL   Hemoglobin 15.4 13.0 - 17.0 g/dL   HCT 04.5 40.9 - 81.1  %   MCV 89.8 78.0 - 100.0 fL   MCH 30.9 26.0 - 34.0 pg   MCHC 34.4 30.0 - 36.0 g/dL   RDW 91.4 78.2 - 95.6 %   Platelets 218 150 - 400 K/uL  I-stat troponin, ED (not at [facility])  Result Value Ref Range   Troponin i, poc 0.00 0.00 - 0.08 ng/mL   Comment 3           Dg Chest 2 View  05/29/2015  CLINICAL DATA:  Left upper chest/shoulder pain x 1 week, started hurting after shoveling snow/ice, severe "pulling" pain in left shoulder/chest area. EXAM: CHEST  2 VIEW COMPARISON:  06/28/2014 FINDINGS: Cardiac silhouette  normal in size and configuration. No mediastinal or hilar masses or evidence of adenopathy. Lungs are hyperexpanded with chronic thickening of the interstitial markings and chronic scarring in the left upper lobe. No evidence of pneumonia or edema. No pleural effusion or pneumothorax. Bony thorax is unremarkable. IMPRESSION: No acute cardiopulmonary disease. Electronically Signed   By: Amie Portland M.D.   On: 05/29/2015 13:11   Dg Shoulder Left  05/29/2015  CLINICAL DATA:  Left shoulder pain since shoveling snow and ice 9 days ago. EXAM: LEFT SHOULDER - 2+ VIEW COMPARISON:  None. FINDINGS: Minimal inferior glenohumeral spur formation. No fracture or dislocation seen. IMPRESSION: Minimal degenerative changes. Electronically Signed   By: Beckie Salts M.D.   On: 05/29/2015 17:07      Imaging Review Dg Chest 2 View  05/29/2015  CLINICAL DATA:  Left upper chest/shoulder pain x 1 week, started hurting after shoveling snow/ice, severe "pulling" pain in left shoulder/chest area. EXAM: CHEST  2 VIEW COMPARISON:  06/28/2014 FINDINGS: Cardiac silhouette normal in size and configuration. No mediastinal or hilar masses or evidence of adenopathy. Lungs are hyperexpanded with chronic thickening of the interstitial markings and chronic scarring in the left upper lobe. No evidence of pneumonia or edema. No pleural effusion or pneumothorax. Bony thorax is unremarkable. IMPRESSION: No acute  cardiopulmonary disease. Electronically Signed   By: Amie Portland M.D.   On: 05/29/2015 13:11   Dg Shoulder Left  05/29/2015  CLINICAL DATA:  Left shoulder pain since shoveling snow and ice 9 days ago. EXAM: LEFT SHOULDER - 2+ VIEW COMPARISON:  None. FINDINGS: Minimal inferior glenohumeral spur formation. No fracture or dislocation seen. IMPRESSION: Minimal degenerative changes. Electronically Signed   By: Beckie Salts M.D.   On: 05/29/2015 17:07   I have personally reviewed and evaluated these images and lab results as part of my medical decision-making.   EKG Interpretation   Date/Time:  Tuesday May 29 2015 12:05:00 EST Ventricular Rate:  103 PR Interval:  162 QRS Duration: 88 QT Interval:  334 QTC Calculation: 437 R Axis:   109 Text Interpretation:  Sinus tachycardia Rightward axis T wave abnormality,  consider inferior ischemia Abnormal ECG T wave changes seen on prior EKG  Confirmed by Shiori Adcox  MD, Lawrence Mitch ([id number]) on 05/29/2015 4:32:02 PM      MDM   Final diagnoses:  Shoulder pain, acute, left    Patient presents with left shoulder pain after shoveling snow. It started about 2 days after he was shoveling snow. It has been going on for about 5 days and gradually getting worse. He has no vascular compromise. He has normal sensation motor function in the hand. It seems to be musculoskeletal in nature. He doesn't have any symptoms that would be more suggestive of acute coronary syndrome. He doesn't have significant spinal discomfort. It seems to be radiating from the shoulder joint itself. He does have a past history of opioid abuse. He was given Toradol in a dose of Valium in the ED for muscle relaxation. He's not really getting much relief with this. I will give him a shot of Decadron prior to discharge. We discussed options for treatment. We've decided to go with tramadol. He's using ibuprofen at home although I advised him to use caution with this as he seems to occasionally have  some stomach upset with it. He states his mom keeps a close track on his medications as well. I gave him referral to follow-up with orthopedics. He was placed in a shoulder sling.  His rash is typical for shingles.  I forgot to give rx on discharge, but contacted him and called rx to [facility] pharmacy on [address] for Valtrex.  Rolan Bucco, MD 05/29/15 9604  Rolan Bucco, MD 05/29/15 2100

## 2019-03-29 ENCOUNTER — Other Ambulatory Visit: Payer: Self-pay

## 2019-03-29 ENCOUNTER — Ambulatory Visit (HOSPITAL_COMMUNITY)
Admission: EM | Admit: 2019-03-29 | Discharge: 2019-03-29 | Disposition: A | Discharge status: Home / Self Care | Payer: BLUE CROSS/BLUE SHIELD | Attending: Family Medicine | Admitting: Family Medicine

## 2019-03-29 ENCOUNTER — Encounter (HOSPITAL_COMMUNITY): Payer: Self-pay

## 2019-03-29 DIAGNOSIS — W57XXXA Bitten or stung by nonvenomous insect and other nonvenomous arthropods, initial encounter: Secondary | ICD-10-CM

## 2019-03-29 DIAGNOSIS — S60561A Insect bite (nonvenomous) of right hand, initial encounter: Secondary | ICD-10-CM

## 2019-03-29 DIAGNOSIS — L03113 Cellulitis of right upper limb: Secondary | ICD-10-CM

## 2019-03-29 MED ORDER — DEXAMETHASONE SODIUM PHOSPHATE 10 MG/ML IJ SOLN
10.0000 mg | Freq: Once | INTRAMUSCULAR | Status: AC
  Administered 2019-03-29: 18:00:00 10 mg via INTRAMUSCULAR

## 2019-03-29 MED ORDER — DOXYCYCLINE HYCLATE 100 MG PO CAPS
100.0000 mg | ORAL_CAPSULE | Freq: Two times a day (BID) | ORAL | 0 refills | Status: DC
Start: 2019-03-29 — End: 2019-10-31

## 2019-03-29 MED ORDER — DEXAMETHASONE SODIUM PHOSPHATE 10 MG/ML IJ SOLN
INTRAMUSCULAR | Status: AC
  Filled 2019-03-29: qty 1

## 2019-03-29 NOTE — ED Triage Notes (Signed)
Pt presents with with swollen right hand with some bruising and redness that patient he claims is from an insect bite.  Pt has puncture marks extending from his knuckles up to his mid-forearms that are in a linear arrangement, hx of heroin abuse.

## 2019-03-30 NOTE — ED Provider Notes (Signed)
[facility]   [id number] 03/29/19 Arrival Time: 1723  ASSESSMENT & PLAN:  1. Insect bite of right hand, initial encounter   2. Cellulitis of right upper extremity      IM Decadron given here this evening. To begin doxycycline asap. Close observation.  Meds ordered this encounter  Medications  . dexamethasone (DECADRON) injection 10 mg  . doxycycline (VIBRAMYCIN) 100 MG capsule    Sig: Take 1 capsule (100 mg total) by mouth 2 (two) times daily.    Dispense:  20 capsule    Refill:  0   Recommend: Follow-up Information    [facility].   Specialty: Urgent Care Why: If symptoms worsen in any way over the next 24-48 hours or if you are failing to improve. Contact information: [address] [place] [address] [phone]          Prefers OTC ibuprofen 800 TID for discomfort.  Reviewed expectations re: course of current medical issues. Questions answered. Outlined signs and symptoms indicating need for more acute intervention. Patient verbalized understanding. After Visit Summary given.  SUBJECTIVE: History from: patient. Todd Horn is a 48 y.o. male who reports persistent moderate pain of his right hand2; described as aching and throbbing; without radiation. Onset: gradual. First noted: yesterday. Injury/trama: questions an insect bite or sting to dorsal R hand just proximal to 2nd digit. Symptoms have gradually worsened since beginning. Aggravating factors: certain movements. Alleviating factors: have not been identified. Associated symptoms: none reported. Extremity sensation changes or weakness: none. Self treatment: ibuprofen with some relief.  History of similar: no.  Past Surgical History:  Procedure Laterality Date  . CARDIAC SURGERY       ROS: As per HPI. All other systems negative.    OBJECTIVE:  Vitals:   03/29/19 1740  BP: (!) 146/97  Pulse: 89  Resp: 18  Temp: 98.5 F  (36.9 C)  TempSrc: Oral  SpO2: 99%    General appearance: alert; no distress HEENT: [place]; AT Neck: supple with FROM CV: RRR Resp: unlabored respirations Extremities: . RUE: warm with well perfused appearance; fairly well localized moderate tenderness over right dorsal hand just proximal to 2nd digit; without gross deformities; swelling: moderate with slight overlying erythema; bruising: none; wrist and fingers with FROM; no sign of FB in skin CV: brisk extremity capillary refill of bilateral UE; 2+ radial pulse of bilateral UE. Skin: warm and dry; no visible rashes Neurologic: gait normal; normal reflexes of RUE; normal sensation of RUE; normal strength of RUE Psychological: alert and cooperative; normal mood and affect    Allergies  Allergen Reactions  . Fish Allergy Other (See Comments)    Trout.  unknown    Past Medical History:  Diagnosis Date  . Chronic back pain   . Depression   . Substance abuse (HCC)    heroin    Social History   Socioeconomic History  . Marital status: Single    Spouse name: Not on file  . Number of children: Not on file  . Years of education: Not on file  . Highest education level: Not on file  Occupational History  . Not on file  Social Needs  . Financial resource strain: Not on file  . Food insecurity    Worry: Not on file    Inability: Not on file  . Transportation needs    Medical: Not on file    Non-medical: Not on file  Tobacco Use  . Smoking  status: Current Every Day Smoker    Packs/day: 0.50    Types: Cigarettes  . Smokeless tobacco: Never Used  Substance and Sexual Activity  . Alcohol use: Yes    Comment: rare  . Drug use: Yes    Types: IV    Comment: heroin; prescription pain meds IV  . Sexual activity: Not on file  Lifestyle  . Physical activity    Days per week: Not on file    Minutes per session: Not on file  . Stress: Not on file  Relationships  . Social Herbalist on phone: Not on file    Gets  together: Not on file    Attends religious service: Not on file    Active member of club or organization: Not on file    Attends meetings of clubs or organizations: Not on file    Relationship status: Not on file  Other Topics Concern  . Not on file  Social History Narrative  . Not on file   Family History  Problem Relation Age of Onset  . Hypertension Mother    Past Surgical History:  Procedure Laterality Date  . CARDIAC SURGERY        Vanessa Kick, MD 03/30/19 0900

## 2019-03-31 ENCOUNTER — Encounter (HOSPITAL_COMMUNITY): Payer: Self-pay | Admitting: Emergency Medicine

## 2019-03-31 ENCOUNTER — Other Ambulatory Visit: Payer: Self-pay

## 2019-03-31 ENCOUNTER — Ambulatory Visit (HOSPITAL_COMMUNITY)
Admission: EM | Admit: 2019-03-31 | Discharge: 2019-03-31 | Disposition: A | Discharge status: Home / Self Care | Payer: Self-pay | Attending: Internal Medicine | Admitting: Internal Medicine

## 2019-03-31 DIAGNOSIS — L03113 Cellulitis of right upper limb: Secondary | ICD-10-CM

## 2019-03-31 DIAGNOSIS — L02519 Cutaneous abscess of unspecified hand: Secondary | ICD-10-CM | POA: Insufficient documentation

## 2019-03-31 MED ORDER — CEFTRIAXONE SODIUM 1 G IJ SOLR
1.0000 g | Freq: Once | INTRAMUSCULAR | Status: AC
  Administered 2019-03-31: 1 g via INTRAMUSCULAR

## 2019-03-31 MED ORDER — CLINDAMYCIN HCL 300 MG PO CAPS: 300.0000 mg | ORAL_CAPSULE | Freq: Three times a day (TID) | ORAL | 0 refills | Status: DC

## 2019-03-31 MED ORDER — CEFTRIAXONE SODIUM 1 G IJ SOLR
INTRAMUSCULAR | Status: AC
  Filled 2019-03-31: qty 10

## 2019-03-31 NOTE — Discharge Instructions (Addendum)
Elevate your hand after you soak it in Ebson salts for 20 minutes. Come back tomorrow am for follow up.

## 2019-03-31 NOTE — ED Triage Notes (Signed)
Was seen 11/17 for same. Wound to right hand, not improving.

## 2019-04-01 ENCOUNTER — Encounter (HOSPITAL_COMMUNITY): Payer: Self-pay

## 2019-04-01 ENCOUNTER — Ambulatory Visit (HOSPITAL_COMMUNITY)
Admission: EM | Admit: 2019-04-01 | Discharge: 2019-04-01 | Disposition: A | Discharge status: Home / Self Care | Payer: Self-pay

## 2019-04-01 ENCOUNTER — Other Ambulatory Visit: Payer: Self-pay

## 2019-04-01 DIAGNOSIS — L02511 Cutaneous abscess of right hand: Secondary | ICD-10-CM

## 2019-04-01 DIAGNOSIS — T63301D Toxic effect of unspecified spider venom, accidental (unintentional), subsequent encounter: Secondary | ICD-10-CM

## 2019-04-01 NOTE — ED Provider Notes (Signed)
[place]    CSN: [id number] Arrival date & time: 03/31/19  1735      History   Chief Complaint Chief Complaint  Patient presents with  . Wound Check    right hand- spider bite?    HPI Todd Horn is a 48 y.o. male. presents for FU R hand cellulitis. States if feels better and is not as swollen. Pain level 2/10 right now. Has developed a bite site raised area but has not been draining. Has not noticed any streaking coming up his arm. Denies fever, chills, myalgias or sweats.     Past Medical History:  Diagnosis Date  . Chronic back pain   . Depression   . Substance abuse ([place])    heroin     There are no active problems to display for this patient.   Past Surgical History:  Procedure Laterality Date  . CARDIAC SURGERY         Home Medications    Prior to Admission medications   Medication Sig Start Date End Date Taking? Authorizing Provider  albuterol (PROVENTIL HFA;VENTOLIN HFA) 108 (90 BASE) MCG/ACT inhaler Inhale 2 puffs into the lungs every 6 (six) hours as needed for wheezing or shortness of breath.    [provider]  clindamycin (CLEOCIN) 300 MG capsule Take 1 capsule (300 mg total) by mouth 3 (three) times daily. 03/31/19   Rodriguez-Southworth, Sunday Spillers, PA-C  doxycycline (VIBRAMYCIN) 100 MG capsule Take 1 capsule (100 mg total) by mouth 2 (two) times daily. 03/29/19   Vanessa Kick, MD  ibuprofen (ADVIL,MOTRIN) 200 MG tablet Take 200 mg by mouth every 6 (six) hours as needed for mild pain or moderate pain.     [provider]    Family History Family History  Problem Relation Age of Onset  . Hypertension Mother     Social History Social History   Tobacco Use  . Smoking status: Current Every Day Smoker    Packs/day: 0.50    Types: Cigarettes  . Smokeless tobacco: Never Used  Substance Use Topics  . Alcohol use: Yes    Comment: rare  . Drug use: Yes    Types: IV    Comment: heroin; prescription pain meds IV      Allergies   Fish allergy   Review of Systems Review of Systems  Constitutional: Negative for appetite change, chills, diaphoresis, fatigue and fever.  Musculoskeletal: Positive for back pain.       Has chronic back pain  Skin: Positive for color change and wound. Negative for pallor.  Hematological: Negative for adenopathy.     Physical Exam Triage Vital Signs ED Triage Vitals  Enc Vitals Group     BP 03/31/19 1805 (!) 149/97     Pulse Rate 03/31/19 1805 100     Resp 03/31/19 1805 16     Temp 03/31/19 1805 99.1 F (37.3 C)     Temp Source 03/31/19 1805 Oral     SpO2 03/31/19 1805 99 %     Weight --      Height --      Head Circumference --      Peak Flow --      Pain Score 03/31/19 1803 7     Pain Loc --      Pain Edu? --      Excl. in [place]? --    No data found.  Updated Vital Signs BP (!) 149/97   Pulse 100   Temp 99.1 F (37.3  C) (Oral)   Resp 16   SpO2 99%   Visual Acuity Right Eye Distance:   Left Eye Distance:   Bilateral Distance:    Right Eye Near:   Left Eye Near:    Bilateral Near:     Physical Exam Vitals signs and nursing note reviewed.  Constitutional:      General: He is not in acute distress.    Appearance: He is obese. He is not toxic-appearing.  HENT:     Head: Normocephalic.     Right Ear: External ear normal.     Left Ear: External ear normal.  Eyes:     General: No scleral icterus.    Conjunctiva/sclera: Conjunctivae normal.  Neck:     Musculoskeletal: Neck supple.  Pulmonary:     Effort: Pulmonary effort is normal.  Musculoskeletal: Normal range of motion.  Skin:    General: Skin is warm.     Capillary Refill: Capillary refill takes less than 2 seconds.     Findings: Erythema present.     Comments: R HAND- has with moderate swelling and mild erythema of dorsal area. Has a 1.3 x 1.3 dark fluctuant area between knuckles.   Neurological:     Mental Status: He is alert.     Gait: Gait normal.  Psychiatric:         Mood and Affect: Mood normal.        Behavior: Behavior normal.        Thought Content: Thought content normal.        Judgment: Judgment normal.      UC Treatments / Results  Labs (all labs ordered are listed, but only abnormal results are displayed) Labs Reviewed  AEROBIC CULTURE (SUPERFICIAL SPECIMEN)    EKG   Radiology No results found.  Procedures Incision and Drainage  Date/Time: 04/01/2019 12:38 PM Performed by: Garey Ham, PA-C Authorized by: Garey Ham, PA-C   Consent:    Consent obtained:  Verbal   Consent given by:  Patient   Risks discussed:  Bleeding, incomplete drainage, pain and damage to other organs   Alternatives discussed:  No treatment Universal protocol:    Patient identity confirmed:  Verbally with patient Location:    Type:  Abscess   Size:  1.3 x 1.3 cm   Location:  Upper extremity   Upper extremity location:  Hand   Hand location:  R hand Pre-procedure details:    Skin preparation:  Betadine (alcohol) Anesthesia (see MAR for exact dosages):    Anesthesia method:  None Procedure type:    Complexity:  Simple Procedure details:    Needle aspiration: yes     Incision types:  Single straight and stab incision   Incision depth:  Dermal   Scalpel blade:  11   Wound management:  Extensive cleaning and irrigated with saline   Drainage:  Purulent and bloody   Drainage amount:  Moderate (malodorus)   Wound treatment:  Wound left open   Packing materials:  1/4 in gauze Post-procedure details:    Patient tolerance of procedure:  Tolerated well, no immediate complications Comments:     Sterile dressing applied with tape.    (including critical care time)  Medications Ordered in UC Medications  cefTRIAXone (ROCEPHIN) injection 1 g (1 g Intramuscular Given 03/31/19 1852)  cefTRIAXone (ROCEPHIN) 1 g injection (has no administration in time range)    Initial Impression / Assessment and Plan / UC Course   I have reviewed the triage vital signs  and the nursing notes. He was given Rocephin 1G IM and added clindamycin to his current Doxy med. FU tomorrow am, so if need to be sent to a hand specialist we can do that. In the mean time, I advised him to soak his hand on Ebson salts.  Final Clinical Impressions(s) / UC Diagnoses   Final diagnoses:  None     Discharge Instructions     Elevate your hand after you soak it in Ebson salts for 20 minutes. Come back tomorrow am for follow up.    ED Prescriptions    Medication Sig Dispense Auth. Provider   clindamycin (CLEOCIN) 300 MG capsule Take 1 capsule (300 mg total) by mouth 3 (three) times daily. 30 capsule Rodriguez-Southworth, Nettie ElmSylvia, PA-C     PDMP not reviewed this encounter.   Garey HamRodriguez-Southworth, Aadvik Roker, PA-C 04/01/19 1253

## 2019-04-01 NOTE — Discharge Instructions (Addendum)
Advised to go to ED for further evaluation and consideration for surgical debridement

## 2019-04-01 NOTE — ED Triage Notes (Signed)
Patient presents to Urgent Care with complaints of needing follow-up on a spider bite to his right hand since being drained yesterday. Patient reports the lady yesterday told him to return for re-evaluation.

## 2019-04-01 NOTE — ED Provider Notes (Signed)
[facility]    CSN: [id number] Arrival date & time: 04/01/19  1137      History   Chief Complaint Chief Complaint  Patient presents with  . Follow-up    HPI Todd Horn is a 48 y.o. male.   89 y old male presented for a follow up regarding a spider bite on right hand. The bite develop into an abscess. Patient was here 03/31/19 and has the abscess  drained. He was prescribed clindamycin. Previously was seen 11/17 and was prescribed doxycycline. He stated he is getting ready to start clindamycin today. He stated the pain has decreased and denied fever, chills.  The history is provided by the patient. No language interpreter was used.     Past Medical History:  Diagnosis Date  . Chronic back pain   . Depression   . Substance abuse (HCC)    heroin     There are no active problems to display for this patient.   Past Surgical History:  Procedure Laterality Date  . CARDIAC SURGERY         Home Medications    Prior to Admission medications   Medication Sig Start Date End Date Taking? Authorizing Provider  doxycycline (VIBRAMYCIN) 100 MG capsule Take 1 capsule (100 mg total) by mouth 2 (two) times daily. 03/29/19  Yes Hagler, Arlys John, MD  albuterol (PROVENTIL HFA;VENTOLIN HFA) 108 (90 BASE) MCG/ACT inhaler Inhale 2 puffs into the lungs every 6 (six) hours as needed for wheezing or shortness of breath.    [provider]  clindamycin (CLEOCIN) 300 MG capsule Take 1 capsule (300 mg total) by mouth 3 (three) times daily. 03/31/19   Rodriguez-Southworth, Nettie Elm, PA-C  ibuprofen (ADVIL,MOTRIN) 200 MG tablet Take 200 mg by mouth every 6 (six) hours as needed for mild pain or moderate pain.     [provider]    Family History Family History  Problem Relation Age of Onset  . Hypertension Mother     Social History Social History   Tobacco Use  . Smoking status: Current Every Day Smoker    Packs/day: 0.50    Types: Cigarettes  .  Smokeless tobacco: Never Used  Substance Use Topics  . Alcohol use: Yes    Comment: rare  . Drug use: Yes    Types: IV    Comment: heroin; prescription pain meds IV     Allergies   Fish allergy   Review of Systems Review of Systems  Constitutional: Negative for activity change, chills, fatigue and fever.  Respiratory: Negative for cough, chest tightness and shortness of breath.   Cardiovascular: Negative for chest pain and leg swelling.  Skin: Positive for color change and pallor.  ROS: All other are negatives   Physical Exam Triage Vital Signs ED Triage Vitals  Enc Vitals Group     BP 04/01/19 1158 126/73     Pulse Rate 04/01/19 1158 80     Resp 04/01/19 1158 16     Temp 04/01/19 1158 98.4 F (36.9 C)     Temp Source 04/01/19 1158 Oral     SpO2 04/01/19 1158 98 %     Weight --      Height --      Head Circumference --      Peak Flow --      Pain Score 04/01/19 1211 0     Pain Loc --      Pain Edu? --      Excl. in GC? --  No data found.  Updated Vital Signs BP 126/73 (BP Location: Left Arm)   Pulse 80   Temp 98.4 F (36.9 C) (Oral)   Resp 16   SpO2 98%   Visual Acuity Right Eye Distance:   Left Eye Distance:   Bilateral Distance:    Right Eye Near:   Left Eye Near:    Bilateral Near:     Physical Exam Vitals signs and nursing note reviewed.  Constitutional:      General: He is not in acute distress.    Appearance: Normal appearance. He is not ill-appearing or toxic-appearing.  Cardiovascular:     Rate and Rhythm: Normal rate and regular rhythm.     Heart sounds: No murmur.  Pulmonary:     Effort: Pulmonary effort is normal. No respiratory distress.     Breath sounds: Normal breath sounds.  Chest:     Chest wall: No tenderness.  Skin:    Coloration: Skin is pale.     Comments: R HAND- has with moderate edema with  mild erythema,  of dorsal area. Skin is pale above Has a 1.3 x 1.3 dark fluctuant area between knuckles  Neurological:      Mental Status: He is alert.   04/01/2019        UC Treatments / Results  Labs (all labs ordered are listed, but only abnormal results are displayed) Labs Reviewed - No data to display  EKG   Radiology No results found.  Procedures Procedures (including critical care time)  Medications Ordered in UC Medications - No data to display  Initial Impression / Assessment and Plan / UC Course  I have reviewed the triage vital signs and the nursing notes.  Pertinent labs & imaging results that were available during my care of the patient were reviewed by me and considered in my medical decision making (see chart for details).   patient was advised to go to ED for further evaluation and consideration of surgical debridement.   Final Clinical Impressions(s) / UC Diagnoses   Final diagnoses:  Spider bite wound, accidental or unintentional, subsequent encounter  Abscess of right hand     Discharge Instructions     Advised to go to ED for further evaluation and consideration for surgical debridement    ED Prescriptions    None     PDMP not reviewed this encounter.   Emerson Monte, [place] 04/01/19 1420

## 2019-04-03 LAB — AEROBIC CULTURE? (SUPERFICIAL SPECIMEN)

## 2019-04-03 LAB — AEROBIC CULTURE W GRAM STAIN (SUPERFICIAL SPECIMEN): Culture: NORMAL

## 2019-04-03 LAB — AEROBIC CULTURE  (SUPERFICIAL SPECIMEN)

## 2019-04-04 ENCOUNTER — Telehealth (HOSPITAL_COMMUNITY): Payer: Self-pay | Admitting: Internal Medicine

## 2019-04-04 NOTE — Telephone Encounter (Signed)
I called pt to give him his wound culture results and check on how he is doing and also, I do not see a hospital note like recommended by the last provider he saw. But his phone rang for a long time and no one picked up.

## 2019-10-28 ENCOUNTER — Ambulatory Visit (HOSPITAL_COMMUNITY): Admission: EM | Admit: 2019-10-28 | Discharge: 2019-10-28 | Discharge status: Against Medical Advice | Payer: Self-pay

## 2019-10-28 ENCOUNTER — Other Ambulatory Visit: Payer: Self-pay

## 2019-10-28 NOTE — ED Notes (Signed)
No answer x 3 from lobby.

## 2019-10-28 NOTE — ED Notes (Signed)
No answer x1

## 2019-10-31 ENCOUNTER — Encounter (HOSPITAL_COMMUNITY): Payer: Self-pay

## 2019-10-31 ENCOUNTER — Other Ambulatory Visit: Payer: Self-pay

## 2019-10-31 ENCOUNTER — Ambulatory Visit (HOSPITAL_COMMUNITY)
Admission: EM | Admit: 2019-10-31 | Discharge: 2019-10-31 | Disposition: A | Discharge status: Home / Self Care | Payer: Self-pay | Attending: Family Medicine | Admitting: Family Medicine

## 2019-10-31 DIAGNOSIS — L0291 Cutaneous abscess, unspecified: Secondary | ICD-10-CM

## 2019-10-31 DIAGNOSIS — M25562 Pain in left knee: Secondary | ICD-10-CM

## 2019-10-31 MED ORDER — CEFTRIAXONE SODIUM 1 G IJ SOLR
INTRAMUSCULAR | Status: AC
  Filled 2019-10-31: qty 10

## 2019-10-31 MED ORDER — LIDOCAINE HCL (PF) 1 % IJ SOLN
INTRAMUSCULAR | Status: AC
  Filled 2019-10-31: qty 4

## 2019-10-31 MED ORDER — CEFTRIAXONE SODIUM 1 G IJ SOLR
1.0000 g | Freq: Once | INTRAMUSCULAR | Status: AC
  Administered 2019-10-31: 1 g via INTRAMUSCULAR

## 2019-10-31 MED ORDER — AMOXICILLIN-POT CLAVULANATE 875-125 MG PO TABS: 1.0000 | ORAL_TABLET | Freq: Two times a day (BID) | ORAL | 0 refills | Status: DC

## 2019-10-31 NOTE — ED Triage Notes (Signed)
Pt reports spider bite in the left hand. Pt lancet and used Neosporin and peroxide in the area where the spider bit him. Lt hand swollen. Pt states he was getting up from the bed this morning and started having left knee pain. Pt can not put weight on the left knee.

## 2019-10-31 NOTE — Discharge Instructions (Signed)
Rocephin given here  Augmentin sent to the pharmacy.  Warm compresses to the hand.  Rest, ice, elevate the knee.  Ibuprofen or Aleve for pain, inflammation and swelling Follow up as needed for continued or worsening symptoms

## 2019-10-31 NOTE — ED Notes (Signed)
Pt refused to use wheelchair, it was offered 2 times.

## 2019-11-01 NOTE — ED Provider Notes (Signed)
[facility]    CSN: [id number] Arrival date & time: 10/31/19  1259      History   Chief Complaint Chief Complaint  Patient presents with  . Insect Bite  . Knee Pain    HPI Todd Horn is a 49 y.o. male.   Patient is a 49 year old male who presents today with abscess to the left hand.  Symptoms been present for the past couple days.  He reports he opened abscess up yesterday and got a lot of purulent drainage.  He has been cleaning with peroxide and doing Neosporin.  The swelling has decreased since yesterday after drainage.  History of substance abuse but reports he has been clean for approximate 6 months.  Currently on methadone.  No fever, chills, nausea. Patient also woke up today with left knee pain and swelling.  No prior injury.  Pain more with ambulation.  There is mild swelling and erythema.  Has been having generalized joint pain since stop using heroin over the past 6 months.  Able to flex and extend the knee without difficulty.  ROS per HPI      Past Medical History:  Diagnosis Date  . Chronic back pain   . Depression   . Substance abuse ([place])    heroin     There are no problems to display for this patient.   Past Surgical History:  Procedure Laterality Date  . CARDIAC SURGERY         Home Medications    Prior to Admission medications   Medication Sig Start Date End Date Taking? Authorizing Provider  albuterol (PROVENTIL HFA;VENTOLIN HFA) 108 (90 BASE) MCG/ACT inhaler Inhale 2 puffs into the lungs every 6 (six) hours as needed for wheezing or shortness of breath.    [provider]  amoxicillin-clavulanate (AUGMENTIN) 875-125 MG tablet Take 1 tablet by mouth every 12 (twelve) hours. 10/31/19   Loura Halt A, NP  ibuprofen (ADVIL,MOTRIN) 200 MG tablet Take 200 mg by mouth every 6 (six) hours as needed for mild pain or moderate pain.     [provider]    Family History Family History  Problem Relation Age of Onset    . Hypertension Mother     Social History Social History   Tobacco Use  . Smoking status: Current Every Day Smoker    Packs/day: 0.50    Types: Cigarettes  . Smokeless tobacco: Never Used  Vaping Use  . Vaping Use: Never used  Substance Use Topics  . Alcohol use: Yes    Comment: rare  . Drug use: Yes    Types: IV    Comment: heroin; prescription pain meds IV     Allergies   Fish allergy   Review of Systems Review of Systems   Physical Exam Triage Vital Signs ED Triage Vitals  Enc Vitals Group     BP 10/31/19 1412 (!) 160/96     Pulse Rate 10/31/19 1412 87     Resp 10/31/19 1412 19     Temp 10/31/19 1412 99.2 F (37.3 C)     Temp Source 10/31/19 1412 Oral     SpO2 10/31/19 1412 98 %     Weight --      Height --      Head Circumference --      Peak Flow --      Pain Score 10/31/19 1411 10     Pain Loc --      Pain Edu? --  Excl. in GC? --    No data found.  Updated Vital Signs BP (!) 160/96 (BP Location: Right Arm)   Pulse 87   Temp 99.2 F (37.3 C) (Oral)   Resp 19   SpO2 98%   Visual Acuity Right Eye Distance:   Left Eye Distance:   Bilateral Distance:    Right Eye Near:   Left Eye Near:    Bilateral Near:     Physical Exam Vitals and nursing note reviewed.  Constitutional:      Appearance: Normal appearance.  HENT:     Head: Normocephalic and atraumatic.     Nose: Nose normal.  Eyes:     Conjunctiva/sclera: Conjunctivae normal.  Pulmonary:     Effort: Pulmonary effort is normal.  Musculoskeletal:     Cervical back: Normal range of motion.     Left knee: Swelling and erythema present. Decreased range of motion. Tenderness present over the MCL.     Comments: Tenderness to medial aspect of knee with generalized swelling, erythema.   Skin:    General: Skin is warm and dry.     Comments: Abscess to dorsal side of the left hand Mild generalized swelling to left hand.  No significant erythema Mildly tender to touch. Good range  of motion of the digits.  Neurological:     Mental Status: He is alert.  Psychiatric:        Mood and Affect: Mood normal.      UC Treatments / Results  Labs (all labs ordered are listed, but only abnormal results are displayed) Labs Reviewed - No data to display  EKG   Radiology No results found.  Procedures Procedures (including critical care time)  Medications Ordered in UC Medications  cefTRIAXone (ROCEPHIN) injection 1 g (1 g Intramuscular Given 10/31/19 1441)    Initial Impression / Assessment and Plan / UC Course  I have reviewed the triage vital signs and the nursing notes.  Pertinent labs & imaging results that were available during my care of the patient were reviewed by me and considered in my medical decision making (see chart for details).     Abscess Patient has already I&D this himself at home he is reporting improvement in swelling and pain since doing this. We will go ahead and cover with Augmentin at this time. Recommend continue warm compresses to the hand  Acute pain of left knee. Most likely arthritis versus meniscus.  Not convinced of septic joint at this time. We will have him rest, ice, elevate the knee and ibuprofen or Aleve for pain and inflammation as needed. Follow up as needed for continued or worsening symptoms   Final Clinical Impressions(s) / UC Diagnoses   Final diagnoses:  Abscess  Acute pain of left knee     Discharge Instructions     Rocephin given here  Augmentin sent to the pharmacy.  Warm compresses to the hand.  Rest, ice, elevate the knee.  Ibuprofen or Aleve for pain, inflammation and swelling Follow up as needed for continued or worsening symptoms      ED Prescriptions    Medication Sig Dispense Auth. Provider   amoxicillin-clavulanate (AUGMENTIN) 875-125 MG tablet Take 1 tablet by mouth every 12 (twelve) hours. 14 tablet Darcie Mellone A, NP     PDMP not reviewed this encounter.   Janace Aris,  NP 11/01/19 [phone]

## 2020-03-14 ENCOUNTER — Emergency Department (HOSPITAL_COMMUNITY)
Admission: EM | Admit: 2020-03-14 | Discharge: 2020-03-14 | Disposition: A | Discharge status: Rehabilitation Facility | Payer: Self-pay | Attending: Emergency Medicine | Admitting: Emergency Medicine

## 2020-03-14 ENCOUNTER — Other Ambulatory Visit: Payer: Self-pay

## 2020-03-14 DIAGNOSIS — T402X1A Poisoning by other opioids, accidental (unintentional), initial encounter: Secondary | ICD-10-CM | POA: Insufficient documentation

## 2020-03-14 DIAGNOSIS — T40601A Poisoning by unspecified narcotics, accidental (unintentional), initial encounter: Secondary | ICD-10-CM

## 2020-03-14 DIAGNOSIS — R000 Tachycardia, unspecified: Secondary | ICD-10-CM | POA: Insufficient documentation

## 2020-03-14 DIAGNOSIS — F17210 Nicotine dependence, cigarettes, uncomplicated: Secondary | ICD-10-CM | POA: Insufficient documentation

## 2020-03-14 NOTE — Discharge Instructions (Addendum)
You were seen in the emergency department after becoming unresponsive.  Your symptoms improved with Narcan.  [facility] is helping you get in to inpatient treatment with [facility].  Return to the emergency department if any worsening or concerning symptoms

## 2020-03-14 NOTE — ED Provider Notes (Signed)
[facility]-EMERGENCY DEPT Provider Note   CSN: [id number] Arrival date & time: 03/14/20  1005     History Chief Complaint  Patient presents with  . Drug Overdose    Todd Horn is a 49 y.o. male.  He is brought in by EMS after his fiance found him in bed unresponsive.  EMS states he was agonal he breathing with pinpoint pupils.  They gave him intranasal Narcan and IV Narcan with improvement in his mental status and respiratory status.  Patient states he had been clean for a year but relapsed.  Snorted fentanyl.  Denies any current complaints.  The history is provided by the patient and the EMS personnel.  Drug Overdose This is a new problem. The current episode started less than 1 hour ago. The problem has been gradually improving. Pertinent negatives include no chest pain, no abdominal pain, no headaches and no shortness of breath. Nothing aggravates the symptoms. Relieved by: narcan. The treatment provided moderate relief.       Past Medical History:  Diagnosis Date  . Chronic back pain   . Depression   . Substance abuse (HCC)    heroin     There are no problems to display for this patient.   Past Surgical History:  Procedure Laterality Date  . CARDIAC SURGERY         Family History  Problem Relation Age of Onset  . Hypertension Mother     Social History   Tobacco Use  . Smoking status: Current Every Day Smoker    Packs/day: 0.50    Types: Cigarettes  . Smokeless tobacco: Never Used  Vaping Use  . Vaping Use: Never used  Substance Use Topics  . Alcohol use: Yes    Comment: rare  . Drug use: Yes    Types: IV    Comment: heroin; prescription pain meds IV    Home Medications Prior to Admission medications   Medication Sig Start Date End Date Taking? Authorizing Provider  albuterol (PROVENTIL HFA;VENTOLIN HFA) 108 (90 BASE) MCG/ACT inhaler Inhale 2 puffs into the lungs every 6 (six) hours as needed for wheezing or shortness of  breath.    [provider]  amoxicillin-clavulanate (AUGMENTIN) 875-125 MG tablet Take 1 tablet by mouth every 12 (twelve) hours. 10/31/19   Dahlia Byes A, NP  ibuprofen (ADVIL,MOTRIN) 200 MG tablet Take 200 mg by mouth every 6 (six) hours as needed for mild pain or moderate pain.     [provider]    Allergies    Fish allergy  Review of Systems   Review of Systems  Constitutional: Negative for fever.  HENT: Negative for sore throat.   Eyes: Negative for visual disturbance.  Respiratory: Negative for shortness of breath.   Cardiovascular: Negative for chest pain.  Gastrointestinal: Negative for abdominal pain.  Genitourinary: Negative for dysuria.  Musculoskeletal: Negative for neck pain.  Skin: Negative for rash.  Neurological: Negative for headaches.    Physical Exam Updated Vital Signs BP (!) 153/82 (BP Location: Right Arm)   Pulse 100   Temp 97.9 F (36.6 C) (Oral)   Resp 13   SpO2 95%   Physical Exam Vitals and nursing note reviewed.  Constitutional:      Appearance: Normal appearance. He is well-developed.  HENT:     Head: Normocephalic and atraumatic.  Eyes:     Conjunctiva/sclera: Conjunctivae normal.  Cardiovascular:     Rate and Rhythm: Regular rhythm. Tachycardia present.  Heart sounds: No murmur heard.   Pulmonary:     Effort: Pulmonary effort is normal. No respiratory distress.     Breath sounds: Normal breath sounds.  Abdominal:     Palpations: Abdomen is soft.     Tenderness: There is no abdominal tenderness.  Musculoskeletal:        General: Normal range of motion.     Cervical back: Neck supple.  Skin:    General: Skin is warm and dry.     Capillary Refill: Capillary refill takes less than 2 seconds.  Neurological:     General: No focal deficit present.     Mental Status: He is alert.     ED Results / Procedures / Treatments   Labs (all labs ordered are listed, but only abnormal results are displayed) Labs Reviewed  - No data to display  EKG None  Radiology No results found.  Procedures Procedures (including critical care time)  Medications Ordered in ED Medications - No data to display  ED Course  I have reviewed the triage vital signs and the nursing notes.  Pertinent labs & imaging results that were available during my care of the patient were reviewed by me and considered in my medical decision making (see chart for details).  Clinical Course as of Mar 14 1800  Wed Mar 14, 2020  1222 Arlys John from [facility] support is evaluated the patient and is getting him inpatient at [facility] in [place].  He asked that we wait for patient's mom to come bring him some clothes   [MB]  1308 Reevaluated patient he remains awake and alert on room air.  He tells me now that he does not want to go to [facility] and wants to arrange his own inpatient after he gets something settled with his fiance and his parents.   [MB]    Clinical Course User Index [MB] Terrilee Files, MD   MDM Rules/Calculators/A&P                         Differential includes overdose, aspiration, pulmonary edema.  He was observed for a few hours without any worsening of his condition.  [facility] has again talked to him and are helping him get to rehab.  Return instructions discussed  Final Clinical Impression(s) / ED Diagnoses Final diagnoses:  Opiate overdose, accidental or unintentional, initial encounter [facility])    Rx / DC Orders ED Discharge Orders    None       Terrilee Files, MD 03/14/20 Flossie Buffy

## 2020-03-14 NOTE — ED Notes (Signed)
PIV d/c prior to discharge.  Pt discharged with Todd Horn, [facility], assistance to rehab. Pt ambulatory at time of discharge. No further needs expressed by pt.

## 2020-03-14 NOTE — Patient Outreach (Signed)
ED Peer Support Specialist Patient Intake (Complete at intake & 30-60 Day Follow-up)  Name: Todd Horn  MRN: [id number]  Age: 49 y.o.   Date of Admission: 03/14/2020  Intake: Initial Comments:      Primary Reason Admitted: Drug Overdose   Lab values: Alcohol/ETOH: Not completed Positive UDS? Drug screen not completed Amphetamines: Drug screen not completed Barbiturates: Drug screen not completed Benzodiazepines: Drug screen not completed Cocaine: Drug screen not completed Opiates: Drug screen not completed Cannabinoids: Drug screen not completed  Demographic information: Gender: Male Ethnicity: Unknown Marital Status: Single Insurance Status: Uninsured/Self-pay Ecologist (Work Neurosurgeon, Physicist, medical, etc.: No Lives with: Partner/Spouse Living situation: House/Apartment  Reported Patient History: Patient reported health conditions: Depression Patient aware of HIV and hepatitis status: No  In past year, has patient visited ED for any reason? No  Number of ED visits:    Reason(s) for visit:    In past year, has patient been hospitalized for any reason? No  Number of hospitalizations:    Reason(s) for hospitalization:    In past year, has patient been arrested? No  Number of arrests:    Reason(s) for arrest:    In past year, has patient been incarcerated? Yes  Number of incarcerations:    Reason(s) for incarceration: forgot to go to court  In past year, has patient received medication-assisted treatment? No  In past year, patient received the following treatments: Non-residential community treatment  In past year, has patient received any harm reduction services? Yes  Did this include any of the following? Purchasing syringes from a pharmacy, Making contact with an SEP  In past year, has patient received care from a mental health provider for diagnosis other than SUD? No  In past year, is this first time patient has  overdosed? No  Number of past overdoses:    In past year, is this first time patient has been hospitalized for an overdose? No  Number of hospitalizations for overdose(s):    Is patient currently receiving treatment for a mental health diagnosis? No  Patient reports experiencing difficulty participating in SUD treatment: No    Most important reason(s) for this difficulty?    Has patient received prior services for treatment? No  In past, patient has received services from following agencies:    Plan of Care:  Suggested follow up at these agencies/treatment centers: Other (comment)  Other information: [facility] met with Pt an was able to complete series of questions to better assist Pt. CPSS was made aware that Pt has been [place] Opioid's. CPSS was made aware that he has been fighting for awhile. CPSS processed with Pt's about [facility], which there is a bed available. CPSS contacted Transportation for transport for to [facility].     Aaron Edelman Vanassa Penniman, CPSS  03/14/2020 12:59 PM

## 2020-03-14 NOTE — ED Triage Notes (Signed)
Pt BIBA from home-  Per EMS-  Pt found by EMS laying prone in bed, agonal breathing, pin point pupils.  2 mg IN narcan + 0.5 mg Narcan IV administered PTA. Pt admits to snorting fentanyl.  Pt seeking rehab assistance.   180/80 100 Hr  20 R  98% RA 20 g L AC  4 mg zofran IV  2 mg IN narcan  0.5 mg IV narcan.

## 2020-03-21 ENCOUNTER — Ambulatory Visit (HOSPITAL_COMMUNITY)
Admission: EM | Admit: 2020-03-21 | Discharge: 2020-03-21 | Disposition: A | Discharge status: Home / Self Care | Payer: Self-pay

## 2020-03-21 ENCOUNTER — Other Ambulatory Visit: Payer: Self-pay

## 2020-04-30 ENCOUNTER — Encounter (HOSPITAL_COMMUNITY): Payer: Self-pay

## 2020-04-30 ENCOUNTER — Other Ambulatory Visit: Payer: Self-pay

## 2020-04-30 ENCOUNTER — Ambulatory Visit (HOSPITAL_COMMUNITY)
Admission: EM | Admit: 2020-04-30 | Discharge: 2020-04-30 | Disposition: A | Discharge status: Home / Self Care | Payer: HRSA Program | Attending: Family Medicine | Admitting: Family Medicine

## 2020-04-30 DIAGNOSIS — Z20822 Contact with and (suspected) exposure to covid-19: Secondary | ICD-10-CM | POA: Diagnosis not present

## 2020-04-30 DIAGNOSIS — J40 Bronchitis, not specified as acute or chronic: Secondary | ICD-10-CM | POA: Insufficient documentation

## 2020-04-30 LAB — SARS CORONAVIRUS 2 (TAT 6-24 HRS): SARS Coronavirus 2: NEGATIVE

## 2020-04-30 MED ORDER — PREDNISONE 10 MG PO TABS: 40.0000 mg | ORAL_TABLET | Freq: Every day | ORAL | 0 refills | Status: AC

## 2020-04-30 MED ORDER — BENZONATATE 100 MG PO CAPS: 200.0000 mg | ORAL_CAPSULE | Freq: Three times a day (TID) | ORAL | 0 refills | Status: DC

## 2020-04-30 MED ORDER — AMOXICILLIN-POT CLAVULANATE 875-125 MG PO TABS: 1.0000 | ORAL_TABLET | Freq: Two times a day (BID) | ORAL | 0 refills | Status: DC

## 2020-04-30 NOTE — ED Triage Notes (Signed)
Pt c/o cough, chills, runny nose and headaches X 3 days. Pt states he has not had a fever and denies any other sxs.

## 2020-04-30 NOTE — Discharge Instructions (Addendum)
Treating you for bronchitis and possible pneumonia.  Take medication as prescribed.  Albuterol inhaler as needed. Follow up as needed for continued or worsening symptoms

## 2020-04-30 NOTE — ED Provider Notes (Signed)
[facility]    CSN: [id number] Arrival date & time: 04/30/20  9833      History   Chief Complaint Chief Complaint  Patient presents with  . Cough  . Nasal Congestion  . Headache    HPI Todd Horn is a 48 y.o. male.   49 year old male that presents today with cough, chills, nasal congestion, rhinorrhea, headaches x3 days.  Mild shortness of breath.  Has been using his inhaler.  Coughing up phlegm.  Current everyday smoker.     Past Medical History:  Diagnosis Date  . Chronic back pain   . Depression   . Substance abuse (HCC)    heroin     There are no problems to display for this patient.   Past Surgical History:  Procedure Laterality Date  . CARDIAC SURGERY         Home Medications    Prior to Admission medications   Medication Sig Start Date End Date Taking? Authorizing Provider  amoxicillin-clavulanate (AUGMENTIN) 875-125 MG tablet Take 1 tablet by mouth every 12 (twelve) hours. 04/30/20   Dahlia Byes A, NP  benzonatate (TESSALON) 100 MG capsule Take 2 capsules (200 mg total) by mouth every 8 (eight) hours. 04/30/20   Dahlia Byes A, NP  methadone (DOLOPHINE) 10 MG/5ML solution Take 40 mg by mouth daily. ADS    [provider]  predniSONE (DELTASONE) 10 MG tablet Take 4 tablets (40 mg total) by mouth daily for 5 days. 04/30/20 05/05/20  Janace Aris, NP    Family History Family History  Problem Relation Age of Onset  . Hypertension Mother     Social History Social History   Tobacco Use  . Smoking status: Current Every Day Smoker    Packs/day: 0.50    Types: Cigarettes  . Smokeless tobacco: Never Used  Vaping Use  . Vaping Use: Never used  Substance Use Topics  . Alcohol use: Yes    Comment: rare  . Drug use: Yes    Types: IV    Comment: heroin; prescription pain meds IV     Allergies   Fish allergy   Review of Systems Review of Systems   Physical Exam Triage Vital Signs ED Triage Vitals  Enc Vitals  Group     BP 04/30/20 0829 136/82     Pulse Rate 04/30/20 0829 (!) 104     Resp 04/30/20 0829 17     Temp 04/30/20 0829 99.3 F (37.4 C)     Temp Source 04/30/20 0829 Oral     SpO2 04/30/20 0829 98 %     Weight --      Height --      Head Circumference --      Peak Flow --      Pain Score 04/30/20 0828 0     Pain Loc --      Pain Edu? --      Excl. in GC? --    No data found.  Updated Vital Signs BP 136/82 (BP Location: Right Arm)   Pulse (!) 104   Temp 99.3 F (37.4 C) (Oral)   Resp 17   SpO2 98%   Visual Acuity Right Eye Distance:   Left Eye Distance:   Bilateral Distance:    Right Eye Near:   Left Eye Near:    Bilateral Near:     Physical Exam Vitals and nursing note reviewed.  Constitutional:      General: He is not in acute distress.  Appearance: Normal appearance. He is not ill-appearing, toxic-appearing or diaphoretic.  HENT:     Head: Normocephalic and atraumatic.     Nose: Nose normal.  Eyes:     Conjunctiva/sclera: Conjunctivae normal.  Cardiovascular:     Rate and Rhythm: Normal rate and regular rhythm.  Pulmonary:     Effort: Pulmonary effort is normal.     Breath sounds: Wheezing and rhonchi present.     Comments: Harsh cough  Musculoskeletal:        General: Normal range of motion.     Cervical back: Normal range of motion.  Skin:    General: Skin is warm and dry.  Neurological:     Mental Status: He is alert.  Psychiatric:        Mood and Affect: Mood normal.      UC Treatments / Results  Labs (all labs ordered are listed, but only abnormal results are displayed) Labs Reviewed  SARS CORONAVIRUS 2 (TAT 6-24 HRS)    EKG   Radiology No results found.  Procedures Procedures (including critical care time)  Medications Ordered in UC Medications - No data to display  Initial Impression / Assessment and Plan / UC Course  I have reviewed the triage vital signs and the nursing notes.  Pertinent labs & imaging results that  were available during my care of the patient were reviewed by me and considered in my medical decision making (see chart for details).     Bronchitis Medication as prescribed. Albuterol as needed. Covid swab pending  Final Clinical Impressions(s) / UC Diagnoses   Final diagnoses:  Bronchitis     Discharge Instructions     Treating you for bronchitis and possible pneumonia.  Take medication as prescribed.  Albuterol inhaler as needed. Follow up as needed for continued or worsening symptoms     ED Prescriptions    Medication Sig Dispense Auth. Provider   predniSONE (DELTASONE) 10 MG tablet Take 4 tablets (40 mg total) by mouth daily for 5 days. 20 tablet Cadyn Fann A, NP   benzonatate (TESSALON) 100 MG capsule Take 2 capsules (200 mg total) by mouth every 8 (eight) hours. 30 capsule Kimberli Winne A, NP   amoxicillin-clavulanate (AUGMENTIN) 875-125 MG tablet Take 1 tablet by mouth every 12 (twelve) hours. 14 tablet Indy Kuck A, NP     PDMP not reviewed this encounter.   Janace Aris, NP 04/30/20 1143

## 2020-06-18 ENCOUNTER — Other Ambulatory Visit: Payer: Self-pay

## 2020-06-18 ENCOUNTER — Ambulatory Visit (HOSPITAL_COMMUNITY)
Admission: EM | Admit: 2020-06-18 | Discharge: 2020-06-18 | Disposition: A | Discharge status: Home / Self Care | Payer: HRSA Program | Attending: Emergency Medicine | Admitting: Emergency Medicine

## 2020-06-18 ENCOUNTER — Encounter (HOSPITAL_COMMUNITY): Payer: Self-pay | Admitting: *Deleted

## 2020-06-18 DIAGNOSIS — M549 Dorsalgia, unspecified: Secondary | ICD-10-CM | POA: Diagnosis not present

## 2020-06-18 DIAGNOSIS — Z20822 Contact with and (suspected) exposure to covid-19: Secondary | ICD-10-CM | POA: Insufficient documentation

## 2020-06-18 DIAGNOSIS — F32A Depression, unspecified: Secondary | ICD-10-CM | POA: Insufficient documentation

## 2020-06-18 DIAGNOSIS — H109 Unspecified conjunctivitis: Secondary | ICD-10-CM

## 2020-06-18 DIAGNOSIS — R059 Cough, unspecified: Secondary | ICD-10-CM | POA: Insufficient documentation

## 2020-06-18 DIAGNOSIS — F1190 Opioid use, unspecified, uncomplicated: Secondary | ICD-10-CM | POA: Insufficient documentation

## 2020-06-18 DIAGNOSIS — J069 Acute upper respiratory infection, unspecified: Secondary | ICD-10-CM | POA: Diagnosis not present

## 2020-06-18 DIAGNOSIS — G8929 Other chronic pain: Secondary | ICD-10-CM | POA: Insufficient documentation

## 2020-06-18 DIAGNOSIS — F17210 Nicotine dependence, cigarettes, uncomplicated: Secondary | ICD-10-CM | POA: Insufficient documentation

## 2020-06-18 LAB — SARS CORONAVIRUS 2 (TAT 6-24 HRS): SARS Coronavirus 2: NEGATIVE

## 2020-06-18 MED ORDER — POLYMYXIN B-TRIMETHOPRIM 10000-0.1 UNIT/ML-% OP SOLN: 1.0000 [drp] | Freq: Four times a day (QID) | OPHTHALMIC | 0 refills | Status: AC

## 2020-06-18 MED ORDER — ALBUTEROL SULFATE HFA 108 (90 BASE) MCG/ACT IN AERS: 2.0000 | INHALATION_SPRAY | RESPIRATORY_TRACT | 0 refills | Status: DC | PRN

## 2020-06-18 NOTE — ED Triage Notes (Signed)
Pt reports Sx's 3 days ago. HA,Sore throat, productive cough

## 2020-06-18 NOTE — ED Provider Notes (Signed)
[facility]    CSN: [id number] Arrival date & time: 06/18/20  2993      History   Chief Complaint Chief Complaint  Patient presents with  . Eye Problem  . Cough  . Headache  . Nasal Congestion    HPI Todd Horn is a 50 y.o. male.   Patient presents with 3-day history of headache, fatigue, sore throat, cough productive of green phlegm.  He also reports bilateral eye redness, itching, matted lashes in the mornings, green drainage in the morning.  He denies acute eye pain, changes in his vision, fever, chills, vomiting, diarrhea, or other symptoms.  No treatments attempted at home.  His albuterol inhaler is expired.  His medical history includes substance abuse, chronic back pain, depression, current everyday smoker.  The history is provided by the patient and medical records.    Past Medical History:  Diagnosis Date  . Chronic back pain   . Depression   . Substance abuse (HCC)    heroin     There are no problems to display for this patient.   Past Surgical History:  Procedure Laterality Date  . CARDIAC SURGERY         Home Medications    Prior to Admission medications   Medication Sig Start Date End Date Taking? Authorizing Provider  albuterol (VENTOLIN HFA) 108 (90 Base) MCG/ACT inhaler Inhale 2 puffs into the lungs every 4 (four) hours as needed for wheezing or shortness of breath. 06/18/20  Yes Mickie Bail, NP  trimethoprim-polymyxin b (POLYTRIM) ophthalmic solution Place 1 drop into both eyes 4 (four) times daily for 7 days. 06/18/20 06/25/20 Yes Mickie Bail, NP  methadone (DOLOPHINE) 10 MG/5ML solution Take 40 mg by mouth daily. ADS    [provider]    Family History Family History  Problem Relation Age of Onset  . Hypertension Mother     Social History Social History   Tobacco Use  . Smoking status: Current Every Day Smoker    Packs/day: 0.50    Types: Cigarettes  . Smokeless tobacco: Never Used  Vaping Use  . Vaping  Use: Never used  Substance Use Topics  . Alcohol use: Yes    Comment: rare  . Drug use: Yes    Types: IV    Comment: heroin; prescription pain meds IV     Allergies   Fish allergy   Review of Systems Review of Systems  Constitutional: Positive for fatigue. Negative for chills and fever.  HENT: Positive for sore throat. Negative for ear pain.   Eyes: Positive for discharge, redness and itching. Negative for pain and visual disturbance.  Respiratory: Positive for cough. Negative for shortness of breath.   Cardiovascular: Negative for chest pain and palpitations.  Gastrointestinal: Negative for abdominal pain, diarrhea and vomiting.  Genitourinary: Negative for dysuria and hematuria.  Musculoskeletal: Negative for arthralgias and back pain.  Skin: Negative for color change and rash.  Neurological: Positive for headaches. Negative for dizziness, syncope, weakness and numbness.  All other systems reviewed and are negative.    Physical Exam Triage Vital Signs ED Triage Vitals  Enc Vitals Group     BP      Pulse      Resp      Temp      Temp src      SpO2      Weight      Height      Head Circumference  Peak Flow      Pain Score      Pain Loc      Pain Edu?      Excl. in GC?    No data found.  Updated Vital Signs BP (!) 122/91 (BP Location: Left Arm)   Pulse 85   Temp 98.7 F (37.1 C)   Resp 18   SpO2 97%   Visual Acuity Right Eye Distance:   Left Eye Distance:   Bilateral Distance:    Right Eye Near:   Left Eye Near:    Bilateral Near:     Physical Exam Vitals and nursing note reviewed.  Constitutional:      General: He is not in acute distress.    Appearance: He is well-developed and well-nourished.  HENT:     Head: Normocephalic and atraumatic.     Mouth/Throat:     Mouth: Mucous membranes are moist.  Eyes:     Extraocular Movements: Extraocular movements intact.     Conjunctiva/sclera:     Right eye: Right conjunctiva is injected.      Left eye: Left conjunctiva is injected.     Pupils: Pupils are equal, round, and reactive to light.  Cardiovascular:     Rate and Rhythm: Normal rate and regular rhythm.     Heart sounds: Normal heart sounds.  Pulmonary:     Effort: Pulmonary effort is normal. No respiratory distress.     Breath sounds: Normal breath sounds.  Abdominal:     Palpations: Abdomen is soft.     Tenderness: There is no abdominal tenderness.  Musculoskeletal:        General: No edema.     Cervical back: Neck supple.  Skin:    General: Skin is warm and dry.  Neurological:     General: No focal deficit present.     Mental Status: He is alert and oriented to person, place, and time.     Gait: Gait normal.  Psychiatric:        Mood and Affect: Mood and affect and mood normal.        Behavior: Behavior normal.      UC Treatments / Results  Labs (all labs ordered are listed, but only abnormal results are displayed) Labs Reviewed  SARS CORONAVIRUS 2 (TAT 6-24 HRS)    EKG   Radiology No results found.  Procedures Procedures (including critical care time)  Medications Ordered in UC Medications - No data to display  Initial Impression / Assessment and Plan / UC Course  I have reviewed the triage vital signs and the nursing notes.  Pertinent labs & imaging results that were available during my care of the patient were reviewed by me and considered in my medical decision making (see chart for details).   Viral URI with cough, conjunctivitis.  PCR COVID pending.  Instructed patient to self quarantine until the test result is back.  Treating with albuterol inhaler and Polytrim eyedrops.  Instructed patient to follow-up with his PCP if his symptoms or not improving.  He agrees to plan of care.   Final Clinical Impressions(s) / UC Diagnoses   Final diagnoses:  Viral URI with cough  Conjunctivitis of both eyes, unspecified conjunctivitis type     Discharge Instructions     Use the albuterol  inhaler as directed.  Use the antibiotic eyedrops as prescribed.    Your COVID test is pending.  You should self quarantine until the test result is back.  Take Tylenol or ibuprofen as needed for fever or discomfort.  Rest and keep yourself hydrated.    Follow-up with your primary care provider if your symptoms are not improving.          ED Prescriptions    Medication Sig Dispense Auth. Provider   albuterol (VENTOLIN HFA) 108 (90 Base) MCG/ACT inhaler Inhale 2 puffs into the lungs every 4 (four) hours as needed for wheezing or shortness of breath. 18 g Mickie Bail, NP   trimethoprim-polymyxin b (POLYTRIM) ophthalmic solution Place 1 drop into both eyes 4 (four) times daily for 7 days. 10 mL Mickie Bail, NP     PDMP not reviewed this encounter.   Mickie Bail, NP 06/18/20 1008

## 2020-06-18 NOTE — Discharge Instructions (Signed)
Use the albuterol inhaler as directed.  Use the antibiotic eyedrops as prescribed.    Your COVID test is pending.  You should self quarantine until the test result is back.    Take Tylenol or ibuprofen as needed for fever or discomfort.  Rest and keep yourself hydrated.    Follow-up with your primary care provider if your symptoms are not improving.

## 2020-06-19 ENCOUNTER — Other Ambulatory Visit: Payer: Self-pay

## 2020-06-19 ENCOUNTER — Ambulatory Visit (HOSPITAL_COMMUNITY)
Admission: EM | Admit: 2020-06-19 | Discharge: 2020-06-19 | Disposition: A | Discharge status: Home / Self Care | Payer: Self-pay | Attending: Family Medicine | Admitting: Family Medicine

## 2020-06-19 ENCOUNTER — Encounter (HOSPITAL_COMMUNITY): Payer: Self-pay

## 2020-06-19 DIAGNOSIS — J069 Acute upper respiratory infection, unspecified: Secondary | ICD-10-CM | POA: Insufficient documentation

## 2020-06-19 DIAGNOSIS — Z1152 Encounter for screening for COVID-19: Secondary | ICD-10-CM | POA: Insufficient documentation

## 2020-06-19 DIAGNOSIS — Z8709 Personal history of other diseases of the respiratory system: Secondary | ICD-10-CM | POA: Insufficient documentation

## 2020-06-19 DIAGNOSIS — H53411 Scotoma involving central area, right eye: Secondary | ICD-10-CM | POA: Insufficient documentation

## 2020-06-19 MED ORDER — BENZONATATE 100 MG PO CAPS: 100.0000 mg | ORAL_CAPSULE | Freq: Three times a day (TID) | ORAL | 0 refills | Status: DC

## 2020-06-19 MED ORDER — CIPROFLOXACIN HCL 0.3 % OP SOLN: 1.0000 [drp] | OPHTHALMIC | 0 refills | Status: AC

## 2020-06-19 MED ORDER — PROMETHAZINE-DM 6.25-15 MG/5ML PO SYRP: 5.0000 mL | ORAL_SOLUTION | Freq: Four times a day (QID) | ORAL | 0 refills | Status: DC | PRN

## 2020-06-19 MED ORDER — ALBUTEROL SULFATE HFA 108 (90 BASE) MCG/ACT IN AERS: 1.0000 | INHALATION_SPRAY | Freq: Four times a day (QID) | RESPIRATORY_TRACT | 0 refills | Status: DC | PRN

## 2020-06-19 NOTE — ED Triage Notes (Signed)
Pt presents with productive cough X 4 days; pt had negative cough yesterday.

## 2020-06-19 NOTE — ED Provider Notes (Signed)
[facility]    CSN: [id number] Arrival date & time: 06/19/20  1054      History   Chief Complaint Chief Complaint  Patient presents with  . Cough    HPI Todd Horn is a 50 y.o. male presenting for 4 days of productive cough. History chronic back pain, depression, substance abuse. Negative home rapid test 1 day ago.  He endorses productive cough with green mucus.  Denies shortness of breath or chest pain. he has a history of asthma, previously controlled on albuterol inhaler, but not on any medications for this currently. Also endorses nasal congestion. Denies fevers/chills, n/v/d, shortness of breath, chest pain,  facial pain, teeth pain, headaches, sore throat, loss of taste/smell, swollen lymph nodes, ear pain.   He also endorses waking up this morning with crusty eyes, and states that his eyes are burning and look red to him.  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 States vision in right eye is 20/200 at baseline. Has not been fully worked up for this. 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  Denies new vision changes.  Denies photophobia, foreign body sensation, eye pain, eye pain with movement, injury to eye, vision changes, double vision, excessive tearing. Wears glasses but not contacts. Denies trauma.   HPI  Past Medical History:  Diagnosis Date  . Chronic back pain   . Depression   . Substance abuse (HCC)    heroin     There are no problems to display for this patient.   Past Surgical History:  Procedure Laterality Date  . CARDIAC SURGERY         Home Medications    Prior to Admission medications   Medication Sig Start Date End Date Taking? Authorizing Provider  albuterol (VENTOLIN HFA) 108 (90 Base) MCG/ACT inhaler Inhale 1-2 puffs into the lungs every 6 (six) hours as needed for wheezing or shortness of breath. 06/19/20  Yes Rhys Martini, PA-C   benzonatate (TESSALON) 100 MG capsule Take 1 capsule (100 mg total) by mouth every 8 (eight) hours. 06/19/20  Yes Rhys Martini, PA-C  ciprofloxacin (CILOXAN) 0.3 % ophthalmic solution Place 1 drop into both eyes every 4 (four) hours while awake for 5 days. 06/19/20 06/24/20 Yes Rhys Martini, PA-C  promethazine-dextromethorphan (PROMETHAZINE-DM) 6.25-15 MG/5ML syrup Take 5 mLs by mouth 4 (four) times daily as needed for cough. 06/19/20  Yes Rhys Martini, PA-C  methadone (DOLOPHINE) 10 MG/5ML solution Take 40 mg by mouth daily. ADS    [provider]  trimethoprim-polymyxin b (POLYTRIM) ophthalmic solution Place 1 drop into both eyes 4 (four) times daily for 7 days. 06/18/20 06/25/20  Mickie Bail, NP    Family History Family History  Problem Relation Age of Onset  . Hypertension Mother     Social History Social History   Tobacco Use  . Smoking status: Current Every Day Smoker    Packs/day: 0.50    Types: Cigarettes  . Smokeless tobacco: Never Used  Vaping Use  . Vaping Use: Never used  Substance Use Topics  . Alcohol use: Yes    Comment: rare  . Drug use: Yes    Types: IV    Comment: heroin; prescription pain meds IV     Allergies   Fish allergy   Review of Systems Review of Systems  Constitutional: Negative for appetite change, chills  and fever.  HENT: Positive for congestion. Negative for ear pain, rhinorrhea, sinus pressure, sinus pain and sore throat.   Eyes: Positive for discharge and itching. Negative for photophobia, pain, redness and visual disturbance.  Respiratory: Positive for cough. Negative for chest tightness, shortness of breath and wheezing.   Cardiovascular: Negative for chest pain and palpitations.  Gastrointestinal: Negative for abdominal pain, constipation, diarrhea, nausea and vomiting.  Genitourinary: Negative for dysuria, frequency and urgency.  Musculoskeletal: Negative for myalgias.  Neurological: Negative for dizziness, weakness and  headaches.  Psychiatric/Behavioral: Negative for confusion.  All other systems reviewed and are negative.    Physical Exam Triage Vital Signs ED Triage Vitals  Enc Vitals Group     BP 06/19/20 1154 (!) 154/80     Pulse Rate 06/19/20 1154 77     Resp 06/19/20 1154 18     Temp 06/19/20 1154 98.2 F (36.8 C)     Temp Source 06/19/20 1154 Oral     SpO2 06/19/20 1154 98 %     Weight --      Height --      Head Circumference --      Peak Flow --      Pain Score 06/19/20 1153 3     Pain Loc --      Pain Edu? --      Excl. in GC? --    No data found.  Updated Vital Signs BP (!) 154/80 (BP Location: Right Arm)   Pulse 77   Temp 98.2 F (36.8 C) (Oral)   Resp 18   SpO2 98%   Visual Acuity Right Eye Distance:   Left Eye Distance:   Bilateral Distance:    Right Eye Near: R Near: 20/100 Left Eye Near:  L Near: 20/70 Bilateral Near:     Physical Exam Vitals reviewed.  Constitutional:      General: He is not in acute distress.    Appearance: Normal appearance. He is not ill-appearing.  HENT:     Head: Normocephalic and atraumatic.     Right Ear: Hearing, tympanic membrane, ear canal and external ear normal. No swelling or tenderness. There is no impacted cerumen. No mastoid tenderness. Tympanic membrane is not perforated, erythematous, retracted or bulging.     Left Ear: Hearing, tympanic membrane, ear canal and external ear normal. No swelling or tenderness. There is no impacted cerumen. No mastoid tenderness. Tympanic membrane is not perforated, erythematous, retracted or bulging.     Nose:     Right Sinus: No maxillary sinus tenderness or frontal sinus tenderness.     Left Sinus: No maxillary sinus tenderness or frontal sinus tenderness.     Mouth/Throat:     Mouth: Mucous membranes are moist.     Pharynx: Uvula midline. No oropharyngeal exudate or posterior oropharyngeal erythema.     Tonsils: No tonsillar exudate.  Eyes:     General: Lids are normal. Lids are  everted, no foreign bodies appreciated. Vision grossly intact. No visual field deficit.       Right eye: No foreign body, discharge or hordeolum.        Left eye: No foreign body, discharge or hordeolum.     Extraocular Movements: Extraocular movements intact.     Conjunctiva/sclera:     Right eye: Right conjunctiva is injected. No chemosis, exudate or hemorrhage.    Left eye: Left conjunctiva is injected. No chemosis, exudate or hemorrhage.    Pupils: Pupils are equal, round, and reactive to light.  Cardiovascular:     Rate and Rhythm: Normal rate and regular rhythm.     Heart sounds: Normal heart sounds.  Pulmonary:     Breath sounds: Normal breath sounds and air entry. No wheezing, rhonchi or rales.  Chest:     Chest wall: No tenderness.  Abdominal:     General: Abdomen is flat. Bowel sounds are normal.     Tenderness: There is no abdominal tenderness. There is no guarding or rebound.  Lymphadenopathy:     Cervical: No cervical adenopathy.  Neurological:     General: No focal deficit present.     Mental Status: He is alert and oriented to person, place, and time.  Psychiatric:        Attention and Perception: Attention and perception normal.        Mood and Affect: Mood and affect normal.        Behavior: Behavior normal. Behavior is cooperative.        Thought Content: Thought content normal.        Judgment: Judgment normal.      UC Treatments / Results  Labs (all labs ordered are listed, but only abnormal results are displayed) Labs Reviewed  SARS CORONAVIRUS 2 (TAT 6-24 HRS)    EKG   Radiology No results found.  Procedures Procedures (including critical care time)  Medications Ordered in UC Medications - No data to display  Initial Impression / Assessment and Plan / UC Course  I have reviewed the triage vital signs and the nursing notes.  Pertinent labs & imaging results that were available during my care of the patient were reviewed by me and considered  in my medical decision making (see chart for details).     50 year old male presenting with productive cough x4 days. History of asthma, currently controlled on no medications. Today he is afebrile nontachycardic nontachypneic, oxygenating well on room air. No wheezes rhonchi or rales. Plan to treat cough symptomatically with tessalon, promethazine DM, albuterol as below.   Patient also presenting with bilateral conjunctival injection and crusting in the morning. Plan to treat with ciprofloxacin drops as below. He wears glasses but not contacts. His right eye has central vision loss at baseline; he denies vision changes.   covid test sent today.   Spent over 40 minutes obtaining H&P, performing physical, discussing results, treatment plan and plan for follow-up with patient. Patient agrees with plan.   This chart was dictated using voice recognition software, Dragon. Despite the best efforts of this provider to proofread and correct errors, errors may still occur which can change documentation meaning.    Final Clinical Impressions(s) / UC Diagnoses   Final diagnoses:  Viral URI with cough  History of asthma  Central loss of vision, right  Encounter for screening for COVID-19     Discharge Instructions     -Tessalon as needed for cough. Take one pill up to 3x daily (every 8 hours) -Promethazine DM cough syrup for congestion/cough. This could make you drowsy, so take at night before bed. -Albuterol inhaler as needed -Ciprofloxacin 1 drop in both eyes every 4 hours while awake. You can also try warm compresses.  -seek additional medical attention if new/worsening shortness of breath, new/worsening fevers/chills, new/worsening chest pain, new vision changes, etc.  -For fevers/chills, body aches, headaches- use Tylenol and Ibuprofen. You can alternate these for maximum effect. Use up to 3000mg  Tylenol daily and 3200mg  Ibuprofen daily. Make sure to take ibuprofen with food. Check the  bottle of ibuprofen/tylenol for specific dosage instructions.  We are currently awaiting result of your PCR covid-19 test. This typically comes back in 1-2 days. We'll call you if the result is positive. Otherwise, the result will be sent electronically to your MyChart. You can also call this clinic and ask for your result via telephone.   Please isolate at home while awaiting these results. If your test is positive for Covid-19, continue to isolate at home for 5 days if you have mild symptoms, or a total of 10 days from symptom onset if you have more severe symptoms. If you quarantine for a shorter period of time (i.e. 5 days), make sure to wear a mask until day 10 of symptoms. Treat your symptoms at home with OTC remedies like tylenol/ibuprofen, mucinex, nyquil, etc. Seek medical attention if you develop high fevers, chest pain, shortness of breath, ear pain, facial pain, etc. Make sure to get up and move around every 2-3 hours while convalescing to help prevent blood clots. Drink plenty of fluids, and rest as much as possible.     ED Prescriptions    Medication Sig Dispense Auth. Provider   albuterol (VENTOLIN HFA) 108 (90 Base) MCG/ACT inhaler Inhale 1-2 puffs into the lungs every 6 (six) hours as needed for wheezing or shortness of breath. 1 each Rhys Martini, PA-C   benzonatate (TESSALON) 100 MG capsule Take 1 capsule (100 mg total) by mouth every 8 (eight) hours. 21 capsule Rhys Martini, PA-C   promethazine-dextromethorphan (PROMETHAZINE-DM) 6.25-15 MG/5ML syrup Take 5 mLs by mouth 4 (four) times daily as needed for cough. 118 mL Rhys Martini, PA-C   ciprofloxacin (CILOXAN) 0.3 % ophthalmic solution Place 1 drop into both eyes every 4 (four) hours while awake for 5 days. 1.3 mL Rhys Martini, PA-C     PDMP not reviewed this encounter.   Rhys Martini, PA-C 06/19/20 1352

## 2020-06-19 NOTE — Discharge Instructions (Addendum)
-  Tessalon as needed for cough. Take one pill up to 3x daily (every 8 hours) -Promethazine DM cough syrup for congestion/cough. This could make you drowsy, so take at night before bed. -Albuterol inhaler as needed -Ciprofloxacin 1 drop in both eyes every 4 hours while awake. You can also try warm compresses.  -seek additional medical attention if new/worsening shortness of breath, new/worsening fevers/chills, new/worsening chest pain, new vision changes, etc.  -For fevers/chills, body aches, headaches- use Tylenol and Ibuprofen. You can alternate these for maximum effect. Use up to 3000mg  Tylenol daily and 3200mg  Ibuprofen daily. Make sure to take ibuprofen with food. Check the bottle of ibuprofen/tylenol for specific dosage instructions.  We are currently awaiting result of your PCR covid-19 test. This typically comes back in 1-2 days. We'll call you if the result is positive. Otherwise, the result will be sent electronically to your MyChart. You can also call this clinic and ask for your result via telephone.   Please isolate at home while awaiting these results. If your test is positive for Covid-19, continue to isolate at home for 5 days if you have mild symptoms, or a total of 10 days from symptom onset if you have more severe symptoms. If you quarantine for a shorter period of time (i.e. 5 days), make sure to wear a mask until day 10 of symptoms. Treat your symptoms at home with OTC remedies like tylenol/ibuprofen, mucinex, nyquil, etc. Seek medical attention if you develop high fevers, chest pain, shortness of breath, ear pain, facial pain, etc. Make sure to get up and move around every 2-3 hours while convalescing to help prevent blood clots. Drink plenty of fluids, and rest as much as possible.

## 2020-06-20 LAB — SARS CORONAVIRUS 2 (TAT 6-24 HRS): SARS Coronavirus 2: NEGATIVE

## 2021-07-18 ENCOUNTER — Other Ambulatory Visit: Payer: Self-pay

## 2021-07-18 ENCOUNTER — Emergency Department (HOSPITAL_COMMUNITY)
Admission: EM | Admit: 2021-07-18 | Discharge: 2021-07-18 | Disposition: A | Discharge status: Home / Self Care | Payer: 59 | Attending: Emergency Medicine | Admitting: Emergency Medicine

## 2021-07-18 ENCOUNTER — Encounter (HOSPITAL_COMMUNITY): Payer: Self-pay | Admitting: Emergency Medicine

## 2021-07-18 ENCOUNTER — Emergency Department (HOSPITAL_COMMUNITY): Payer: 59

## 2021-07-18 DIAGNOSIS — F439 Reaction to severe stress, unspecified: Secondary | ICD-10-CM | POA: Diagnosis not present

## 2021-07-18 DIAGNOSIS — R000 Tachycardia, unspecified: Secondary | ICD-10-CM | POA: Insufficient documentation

## 2021-07-18 DIAGNOSIS — R0602 Shortness of breath: Secondary | ICD-10-CM | POA: Insufficient documentation

## 2021-07-18 LAB — BASIC METABOLIC PANEL
Anion gap: 7 (ref 5–15)
BUN: 13 mg/dL (ref 6–20)
CO2: 28 mmol/L (ref 22–32)
Calcium: 9.4 mg/dL (ref 8.9–10.3)
Chloride: 102 mmol/L (ref 98–111)
Creatinine, Ser: 0.76 mg/dL (ref 0.61–1.24)
GFR, Estimated: >60 mL/min (ref >60)
Glucose, Bld: 123 mg/dL — ABNORMAL HIGH (ref 70–99)
Potassium: 4.2 mmol/L (ref 3.5–5.1)
Sodium: 137 mmol/L (ref 135–145)

## 2021-07-18 LAB — CBC WITH DIFFERENTIAL/PLATELET
Abs Immature Granulocytes: 0.03 10*3/uL (ref 0.00–0.07)
Basophils Absolute: 0 10*3/uL (ref 0.0–0.1)
Basophils Relative: 1 %
Eosinophils Absolute: 0.3 10*3/uL (ref 0.0–0.5)
Eosinophils Relative: 4 %
HCT: 44 % (ref 39.0–52.0)
Hemoglobin: 14.6 g/dL (ref 13.0–17.0)
Immature Granulocytes: 0 %
Lymphocytes Relative: 31 %
Lymphs Abs: 2.4 10*3/uL (ref 0.7–4.0)
MCH: 30.7 pg (ref 26.0–34.0)
MCHC: 33.2 g/dL (ref 30.0–36.0)
MCV: 92.6 fL (ref 80.0–100.0)
Monocytes Absolute: 0.7 10*3/uL (ref 0.1–1.0)
Monocytes Relative: 9 %
Neutro Abs: 4.3 10*3/uL (ref 1.7–7.7)
Neutrophils Relative %: 55 %
Platelets: 220 10*3/uL (ref 150–400)
RBC: 4.75 MIL/uL (ref 4.22–5.81)
RDW: 12.8 % (ref 11.5–15.5)
WBC: 7.7 10*3/uL (ref 4.0–10.5)
nRBC: 0 % (ref 0.0–0.2)

## 2021-07-18 LAB — TROPONIN I (HIGH SENSITIVITY): Troponin I (High Sensitivity): 6 ng/L (ref <18)

## 2021-07-18 MED ORDER — LORAZEPAM 1 MG PO TABS
1.0000 mg | ORAL_TABLET | Freq: Once | ORAL | Status: AC
  Administered 2021-07-18: 19:00:00 1 mg via ORAL
  Filled 2021-07-18: qty 1
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
## 2021-07-18 NOTE — Discharge Instructions (Addendum)
As we discussed recommend that you get some rest, get in contact with a new primary care doctor, and consider talking to cardiology about the shortness of breath with exertion that you been having.  You had no evidence of heart attack or other abnormalities on your work-up today.  Additionally I provided some resources to help with psychiatric counseling, and substance abuse.  Please return to the emergency department if you have return of chest pain, shortness of breath, loss of consciousness, or other concerns.  It was a pleasure taking care of you today. ?
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
## 2021-07-19 ENCOUNTER — Telehealth: Payer: Self-pay

## 2021-07-19 NOTE — Telephone Encounter (Signed)
RNCM called patient's cell# [phone] no answer and no voicemail set up. ? ?RNCM called patient's home# [phone], patient's mother states he went to the store and will be right back. ? ? ?

## 2021-07-19 NOTE — Telephone Encounter (Signed)
RNCM spoke with [email or web address]  [phone] who advised they accept [employer] and only one PCP can see patient however appointments are 6 weeks out. This PCP suggested call [place] [facility]. ? ?This RNCM spoke with [facility] who scheduled new pt/ ED f/u appointment for 07/24/21 at 11am (patient needs to arrive at 10:30a) with Alisa Allwardt.  ? ?  ?

## 2021-07-19 NOTE — Telephone Encounter (Signed)
RNCM received call from patient and advised f/u ED PCP appointment to establish care has been scheduled with [place] (A. Allwardt)  [phone] on 07/24/21 at 11am however patient needs to be there at 10:30am.  ? ?No additional TOC needs at this time. ?

## 2021-07-24 ENCOUNTER — Ambulatory Visit: Payer: Self-pay | Admitting: Physician Assistant

## 2021-11-14 ENCOUNTER — Ambulatory Visit (HOSPITAL_COMMUNITY)
Admission: EM | Admit: 2021-11-14 | Discharge: 2021-11-14 | Disposition: A | Discharge status: Home / Self Care | Payer: Self-pay | Attending: Physician Assistant | Admitting: Physician Assistant

## 2021-11-14 ENCOUNTER — Encounter (HOSPITAL_COMMUNITY): Payer: Self-pay

## 2021-11-14 DIAGNOSIS — I10 Essential (primary) hypertension: Secondary | ICD-10-CM

## 2021-11-14 DIAGNOSIS — I878 Other specified disorders of veins: Secondary | ICD-10-CM | POA: Diagnosis not present

## 2021-11-14 DIAGNOSIS — L089 Local infection of the skin and subcutaneous tissue, unspecified: Secondary | ICD-10-CM

## 2021-11-14 LAB — POCT URINALYSIS DIPSTICK, ED / UC
Bilirubin Urine: NEGATIVE
Glucose, UA: NEGATIVE mg/dL
Hgb urine dipstick: NEGATIVE
Ketones, ur: NEGATIVE mg/dL
Leukocytes,Ua: NEGATIVE
Nitrite: NEGATIVE
Protein, ur: NEGATIVE mg/dL
Specific Gravity, Urine: 1.025 (ref 1.005–1.030)
Urobilinogen, UA: 1 mg/dL (ref 0.0–1.0)
pH: 7 (ref 5.0–8.0)

## 2021-11-14 LAB — CBG MONITORING, ED: Glucose-Capillary: 132 mg/dL — ABNORMAL HIGH (ref 70–99)

## 2021-11-14 MED ORDER — AMLODIPINE BESYLATE 5 MG PO TABS: 5.0000 mg | ORAL_TABLET | Freq: Every day | ORAL | 2 refills | Status: AC

## 2021-11-14 MED ORDER — DOXYCYCLINE HYCLATE 100 MG PO CAPS: 100.0000 mg | ORAL_CAPSULE | Freq: Two times a day (BID) | ORAL | 0 refills | Status: DC

## 2021-11-14 NOTE — ED Provider Notes (Signed)
[facility]    CSN: [id number] Arrival date & time: 11/14/21  1921      History   Chief Complaint Chief Complaint  Patient presents with   redness on legs    HPI Todd Horn is a 51 y.o. male.   Patient presents today with a prolonged history of bilateral leg redness and swelling.  Recently has developed a wound that has started to ooze prompting evaluation.  He denies history of peripheral artery disease or venous stasis.  Denies previous blood clots or varicosities.  He does report that he has gained approximately 70 pounds in the past few months as he has stopped using illicit substances.  He denies any significant pain, fever, nausea, vomiting.  Denies any recent antibiotics.  Denies history of MRSA or recurrent skin infections.  He denies history of diabetes but reports that his sugar intake has been higher and so he is concerned that he might have developed this.  Denies polyuria, polydipsia, polyphasia, symptomatic hyper or hypoglycemia.  Patient was noted to have elevated blood pressure.  Denies any chest pain, shortness of breath, headache, dizziness, vision change.  He is not monitoring his blood pressure at home.  He does not take any antihypertensive medication.  He does report episodes with physical activity where he gets lightheaded but this has not occurred for several weeks.  He is open to starting medication if appropriate today.    Past Medical History:  Diagnosis Date   Chronic back pain    Depression    Substance abuse (HCC)    heroin     There are no problems to display for this patient.   Past Surgical History:  Procedure Laterality Date   CARDIAC SURGERY         Home Medications    Prior to Admission medications   Medication Sig Start Date End Date Taking? Authorizing Provider  amLODipine (NORVASC) 5 MG tablet Take 1 tablet (5 mg total) by mouth daily. 11/14/21  Yes Mikiyah Glasner K, PA-C  doxycycline (VIBRAMYCIN) 100 MG capsule Take 1  capsule (100 mg total) by mouth 2 (two) times daily. 11/14/21  Yes Jaxen Samples, Noberto Retort, PA-C    Family History Family History  Problem Relation Age of Onset   Hypertension Mother     Social History Social History   Tobacco Use   Smoking status: Every Day    Packs/day: 0.50    Types: Cigarettes   Smokeless tobacco: Never  Vaping Use   Vaping Use: Never used  Substance Use Topics   Alcohol use: Yes    Comment: rare   Drug use: Yes    Types: IV    Comment: heroin; prescription pain meds IV     Allergies   Fish allergy   Review of Systems Review of Systems  Constitutional:  Positive for activity change. Negative for appetite change, fatigue and fever.  Eyes:  Negative for visual disturbance.  Respiratory:  Negative for cough and shortness of breath.   Cardiovascular:  Positive for leg swelling. Negative for chest pain and palpitations.  Gastrointestinal:  Negative for abdominal pain, diarrhea, nausea and vomiting.  Endocrine: Negative for polydipsia, polyphagia and polyuria.  Musculoskeletal:  Negative for arthralgias and myalgias.  Skin:  Positive for color change and wound.  Neurological:  Negative for dizziness, light-headedness and headaches.     Physical Exam Triage Vital Signs ED Triage Vitals  Enc Vitals Group     BP 11/14/21 1929 (!) 176/84  Pulse Rate 11/14/21 1929 93     Resp 11/14/21 1929 16     Temp 11/14/21 1929 98.4 F (36.9 C)     Temp Source 11/14/21 1929 Oral     SpO2 11/14/21 1929 96 %     Weight --      Height --      Head Circumference --      Peak Flow --      Pain Score 11/14/21 1928 0     Pain Loc --      Pain Edu? --      Excl. in GC? --    No data found.  Updated Vital Signs BP (!) 176/90 (BP Location: Right Arm)   Pulse 93   Temp 98.4 F (36.9 C) (Oral)   Resp 16   SpO2 96%   Visual Acuity Right Eye Distance:   Left Eye Distance:   Bilateral Distance:    Right Eye Near:   Left Eye Near:    Bilateral Near:      Physical Exam Vitals reviewed.  Constitutional:      General: He is awake.     Appearance: Normal appearance. He is well-developed. He is not ill-appearing.     Comments: Very pleasant male appears stated age in no acute distress sitting comfortably in exam room  HENT:     Head: Normocephalic and atraumatic.  Cardiovascular:     Rate and Rhythm: Normal rate and regular rhythm.     Heart sounds: Normal heart sounds, S1 normal and S2 normal. No murmur heard.    Comments: 2+ pitting edema to mid anterior tibia bilaterally Pulmonary:     Effort: Pulmonary effort is normal.     Breath sounds: Normal breath sounds. No stridor. No wheezing, rhonchi or rales.     Comments: Clear to auscultation bilaterally Abdominal:     General: Bowel sounds are normal.     Palpations: Abdomen is soft.     Tenderness: There is no abdominal tenderness.  Musculoskeletal:     Right lower leg: No tenderness. 2+ Edema present.     Left lower leg: No tenderness. 2+ Edema present.  Skin:    Findings: Wound present.          Comments: 3 cm x 2 cm ulcerated lesion with serous drainage noted posterior right lower leg.  Significant erythema noted medial portion of bilateral lower legs without significant warmth to touch.  No streaking or evidence lymphangitis.  Neurological:     Mental Status: He is alert.  Psychiatric:        Behavior: Behavior is cooperative.      UC Treatments / Results  Labs (all labs ordered are listed, but only abnormal results are displayed) Labs Reviewed  CBG MONITORING, ED - Abnormal; Notable for the following components:      Result Value   Glucose-Capillary 132 (*)    All other components within normal limits  POCT URINALYSIS DIPSTICK, ED / UC    EKG   Radiology No results found.  Procedures Procedures (including critical care time)  Medications Ordered in UC Medications - No data to display  Initial Impression / Assessment and Plan / UC Course  I have  reviewed the triage vital signs and the nursing notes.  Pertinent labs & imaging results that were available during my care of the patient were reviewed by me and considered in my medical decision making (see chart for details).     Suspect that erythema and  swelling is related to venous stasis given clinical presentation.  Concern for secondary bacterial infection on right given wound with associated drainage.  Will cover for MRSA given history with doxycycline 100 mg twice daily for 10 days.  Recommend to keep area clean with soap and water.  Did recommend he follow-up with vascular surgery for further evaluation and management and was given contact information for local provider with instruction to call to schedule an appointment.  Discussed that if he has any worsening symptoms including increased swelling or pain, fever, nausea, vomiting he needs to be seen immediately.  Random blood sugar was appropriate at 132.  UA showed no glucosuria or other abnormalities.  Discussed that he can have additional blood work with his primary care provider; he does not currently have a PCP so we will try to establish him with someone via PCP assistance.  Blood pressure is elevated today.  Patient denies any signs/symptoms of endorgan damage.  We will amlodipine 5 mg daily with anticipation of increasing this further in the future.  Discussed additional antihypertensive medication but patient declined to have blood work drawn today so we are unable to monitor kidney function and electrolytes to start thiazide diuretic or ACE inhibitor.  We did discuss that this can increase leg swelling and he is to keep his feet elevated and avoid sodium.  Also recommended compression stockings.  Discussed that he should monitor his blood pressure at home.  If persistently elevated he is to return for reevaluation if unable to see PCP within a few weeks.  Discussed alarm symptoms that warrant emergent evaluation including chest pain,  shortness of breath, headache, vision change, dizziness.  Strict return precautions given.  Final Clinical Impressions(s) / UC Diagnoses   Final diagnoses:  Skin infection  Venous stasis  Elevated blood pressure reading with diagnosis of hypertension     Discharge Instructions      I believe that you have something called venous stasis which is related to blood flow in your legs.  Please follow-up with vascular surgery for further evaluation and management.  Keep your legs elevated and use compression stockings.  Start doxycycline to cover for secondary infection.  Stay out of the sun while on this medication.  If anything worsens please return for reevaluation.  Your blood pressure is elevated.  Please start amlodipine 5 mg daily.  We will try to establish with a primary care provider and someone should reach out to you to help schedule an appointment.  Avoid NSAIDs (aspirin, ibuprofen/Advil/Motrin, naproxen/Aleve) decongestants, caffeine, sodium.  Monitor blood pressure at home.  The amlodipine can increase lower extremity edema and if this happens please return for reevaluation.  If you develop any chest pain, shortness of breath, headache, vision change, dizziness in setting of high blood pressure you need to go to the emergency room.     ED Prescriptions     Medication Sig Dispense Auth. Provider   doxycycline (VIBRAMYCIN) 100 MG capsule Take 1 capsule (100 mg total) by mouth 2 (two) times daily. 20 capsule Kristiane Morsch K, PA-C   amLODipine (NORVASC) 5 MG tablet Take 1 tablet (5 mg total) by mouth daily. 30 tablet Ebon Ketchum, Noberto Retort, PA-C      PDMP not reviewed this encounter.   Jeani Hawking, PA-C 11/14/21 1953

## 2021-11-14 NOTE — ED Triage Notes (Signed)
Pt presents with c/o redness to bilateral legs x1 month. Pt states legs are itchy and has a healing sore to back of rt leg. 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

## 2021-11-14 NOTE — Discharge Instructions (Addendum)
I believe that you have something called venous stasis which is related to blood flow in your legs.  Please follow-up with vascular surgery for further evaluation and management.  Keep your legs elevated and use compression stockings.  Start doxycycline to cover for secondary infection.  Stay out of the sun while on this medication.  If anything worsens please return for reevaluation.  Your blood pressure is elevated.  Please start amlodipine 5 mg daily.  We will try to establish with a primary care provider and someone should reach out to you to help schedule an appointment.  Avoid NSAIDs (aspirin, ibuprofen/Advil/Motrin, naproxen/Aleve) decongestants, caffeine, sodium.  Monitor blood pressure at home.  The amlodipine can increase lower extremity edema and if this happens please return for reevaluation.  If you develop any chest pain, shortness of breath, headache, vision change, dizziness in setting of high blood pressure you need to go to the emergency room.

## 2021-11-15 ENCOUNTER — Encounter (HOSPITAL_COMMUNITY): Payer: Self-pay

## 2022-02-22 ENCOUNTER — Ambulatory Visit (HOSPITAL_COMMUNITY)
Admission: EM | Admit: 2022-02-22 | Discharge: 2022-02-22 | Disposition: A | Discharge status: Home / Self Care | Payer: Self-pay | Attending: Physician Assistant | Admitting: Physician Assistant

## 2022-02-22 ENCOUNTER — Encounter (HOSPITAL_COMMUNITY): Payer: Self-pay

## 2022-02-22 DIAGNOSIS — I872 Venous insufficiency (chronic) (peripheral): Secondary | ICD-10-CM

## 2022-02-22 MED ORDER — DOXYCYCLINE HYCLATE 100 MG PO CAPS: 100.0000 mg | ORAL_CAPSULE | Freq: Two times a day (BID) | ORAL | 0 refills | Status: AC

## 2022-02-22 NOTE — Discharge Instructions (Signed)
Advised to take the doxycycline every 12 hours on a regular basis to treat the infection. Advised to keep the areas clean and dry. Advised to see the vascular specialist at the appointment time that you have an order for the condition to be evaluated and suggested treatment options to be initiated. Advised to follow-up PCP or return to urgent care if symptoms fail to improve.

## 2022-02-22 NOTE — ED Provider Notes (Signed)
[place]    CSN: [id number] Arrival date & time: 02/22/22  1513      History   Chief Complaint Chief Complaint  Patient presents with   Blister    HPI Todd Horn is a 51 y.o. male.   51 year old male presents with a rash lower legs.  Patient indicates that he completed the antibiotic and the rash almost resolved however over the past couple weeks the rash has returned.  He relates that he is having some tenderness, redness of both lower legs with the left being worse, ulcerations that have started developing on the left lower leg at the upper anterior portion and the inner aspect of the ankle.  He relates that he has drainage from these areas and the skin is weeping.  He indicates he has not have any fever, or chills.  Patient indicates that he does have a appointment to see the vascular specialist but it is in 6 weeks.  He does relate that he has gained 70 pounds since he was having to care for his mother who recently has passed and his father.  He indicates he does have a PCP but he has not seen him in quite some time, and the patient's been advised that he needs to become established again with his PCP and have a hemoglobin A1c checked due to his glucose being elevated at 126 on 11/17/2021.  He does relate that he used to smoke but that he quit 15 years ago.     Past Medical History:  Diagnosis Date   Chronic back pain    Depression    Substance abuse ([place])    heroin     There are no problems to display for this patient.   Past Surgical History:  Procedure Laterality Date   CARDIAC SURGERY         Home Medications    Prior to Admission medications   Medication Sig Start Date End Date Taking? Authorizing Provider  amLODipine (NORVASC) 5 MG tablet Take 1 tablet (5 mg total) by mouth daily. 11/14/21   Raspet, Derry Skill, PA-C  doxycycline (VIBRAMYCIN) 100 MG capsule Take 1 capsule (100 mg total) by mouth 2 (two) times daily. 02/22/22   Nyoka Lint, PA-C     Family History Family History  Problem Relation Age of Onset   Hypertension Mother     Social History Social History   Tobacco Use   Smoking status: Every Day    Packs/day: 0.50    Types: Cigarettes   Smokeless tobacco: Never  Vaping Use   Vaping Use: Never used  Substance Use Topics   Alcohol use: Yes    Comment: rare   Drug use: Yes    Types: IV    Comment: heroin; prescription pain meds IV     Allergies   Fish allergy   Review of Systems Review of Systems  Skin:  Positive for rash (bilat lower legs).     Physical Exam Triage Vital Signs ED Triage Vitals  Enc Vitals Group     BP 02/22/22 1605 (!) 147/79     Pulse Rate 02/22/22 1605 89     Resp 02/22/22 1605 18     Temp 02/22/22 1605 97.9 F (36.6 C)     Temp Source 02/22/22 1605 Oral     SpO2 02/22/22 1605 98 %     Weight --      Height --      Head Circumference --  Peak Flow --      Pain Score 02/22/22 1532 2     Pain Loc --      Pain Edu? --      Excl. in [place]? --    No data found.  Updated Vital Signs BP (!) 147/79 (BP Location: Left Arm)   Pulse 89   Temp 97.9 F (36.6 C) (Oral)   Resp 18   SpO2 98%   Visual Acuity Right Eye Distance:   Left Eye Distance:   Bilateral Distance:    Right Eye Near:   Left Eye Near:    Bilateral Near:     Physical Exam Constitutional:      Appearance: Normal appearance.  Neck:     Vascular: No carotid bruit.  Cardiovascular:     Rate and Rhythm: Normal rate and regular rhythm.     Heart sounds: Normal heart sounds.  Pulmonary:     Effort: Pulmonary effort is normal.     Breath sounds: Normal breath sounds and air entry. No wheezing, rhonchi or rales.  Lymphadenopathy:     Cervical: No cervical adenopathy.  Skin:         Comments: Bilat Lower Legs: (refer to pictures) The left leg has stasis dermatitis developed from the upper part of the lower leg to the ankle with worst area being anterior and lower medial.  There are 2-3 separate  ulcers to starting ulcers on the upper anterior tibial area that are 1 x 2 cm that have clear drainage and is more superficial, the second ulcer is in the medial area of the ankle with a 1 x 2 cm superficial ulcer.  There is redness and mild weeping from the stasis dermatitis region. Right lower leg there is stasis dermatitis reaction that is from the midpoint of the lower leg to the ankle, there are no ulcerations present but there is redness and mild weeping noted.   Neurological:     Mental Status: He is alert.     Left Leg  Left  Right   UC Treatments / Results  Labs (all labs ordered are listed, but only abnormal results are displayed) Labs Reviewed - No data to display  EKG   Radiology No results found.  Procedures Procedures (including critical care time)  Medications Ordered in UC Medications - No data to display  Initial Impression / Assessment and Plan / UC Course  I have reviewed the triage vital signs and the nursing notes.  Pertinent labs & imaging results that were available during my care of the patient were reviewed by me and considered in my medical decision making (see chart for details).    Plan: 1.  The stasis dermatitis will be treated with the following: A.  Doxycycline 100 mg every 12 hours to treat the infectious process. B.  Patient has been advised to keep the appointment with the vascular specialist in order to have the stasis dermatitis evaluated and appropriate clinical treatment regimens initiated. C.  Patient advised to become established with PCP and to have a hemoglobin A1c drawn as his glucose level was elevated on 11/17/2021 at 126. D.  Patient advised to change diet and try and lose weight as this will help to bring the glucose levels to normal values, and also decrease progression of the stasis dermatitis. 2.  Patient advised to return to urgent care as needed. Final Clinical Impressions(s) / UC Diagnoses   Final diagnoses:  Stasis  dermatitis of both legs  Discharge Instructions      Advised to take the doxycycline every 12 hours on a regular basis to treat the infection. Advised to keep the areas clean and dry. Advised to see the vascular specialist at the appointment time that you have an order for the condition to be evaluated and suggested treatment options to be initiated. Advised to follow-up PCP or return to urgent care if symptoms fail to improve.     ED Prescriptions     Medication Sig Dispense Auth. Provider   doxycycline (VIBRAMYCIN) 100 MG capsule Take 1 capsule (100 mg total) by mouth 2 (two) times daily. 30 capsule Nyoka Lint, PA-C      PDMP not reviewed this encounter.   Nyoka Lint, PA-C 02/22/22 1635

## 2022-02-22 NOTE — ED Triage Notes (Signed)
Blisters on both legs starting 1 week ago. Patient states he was treated with antibiotics and referral to vascular the last time this happened.  Symptoms better with antibiotics but did not fully go away.  Itching and drainage. No spreading

## 2024-02-25 IMAGING — CR DG CHEST 2V
2 series · 2 of 2 positions shown · non-contrast
Comparison: May 29, 2015.

CLINICAL DATA: Chest pain.

EXAM:
CHEST - 2 VIEW

[w chest pa]
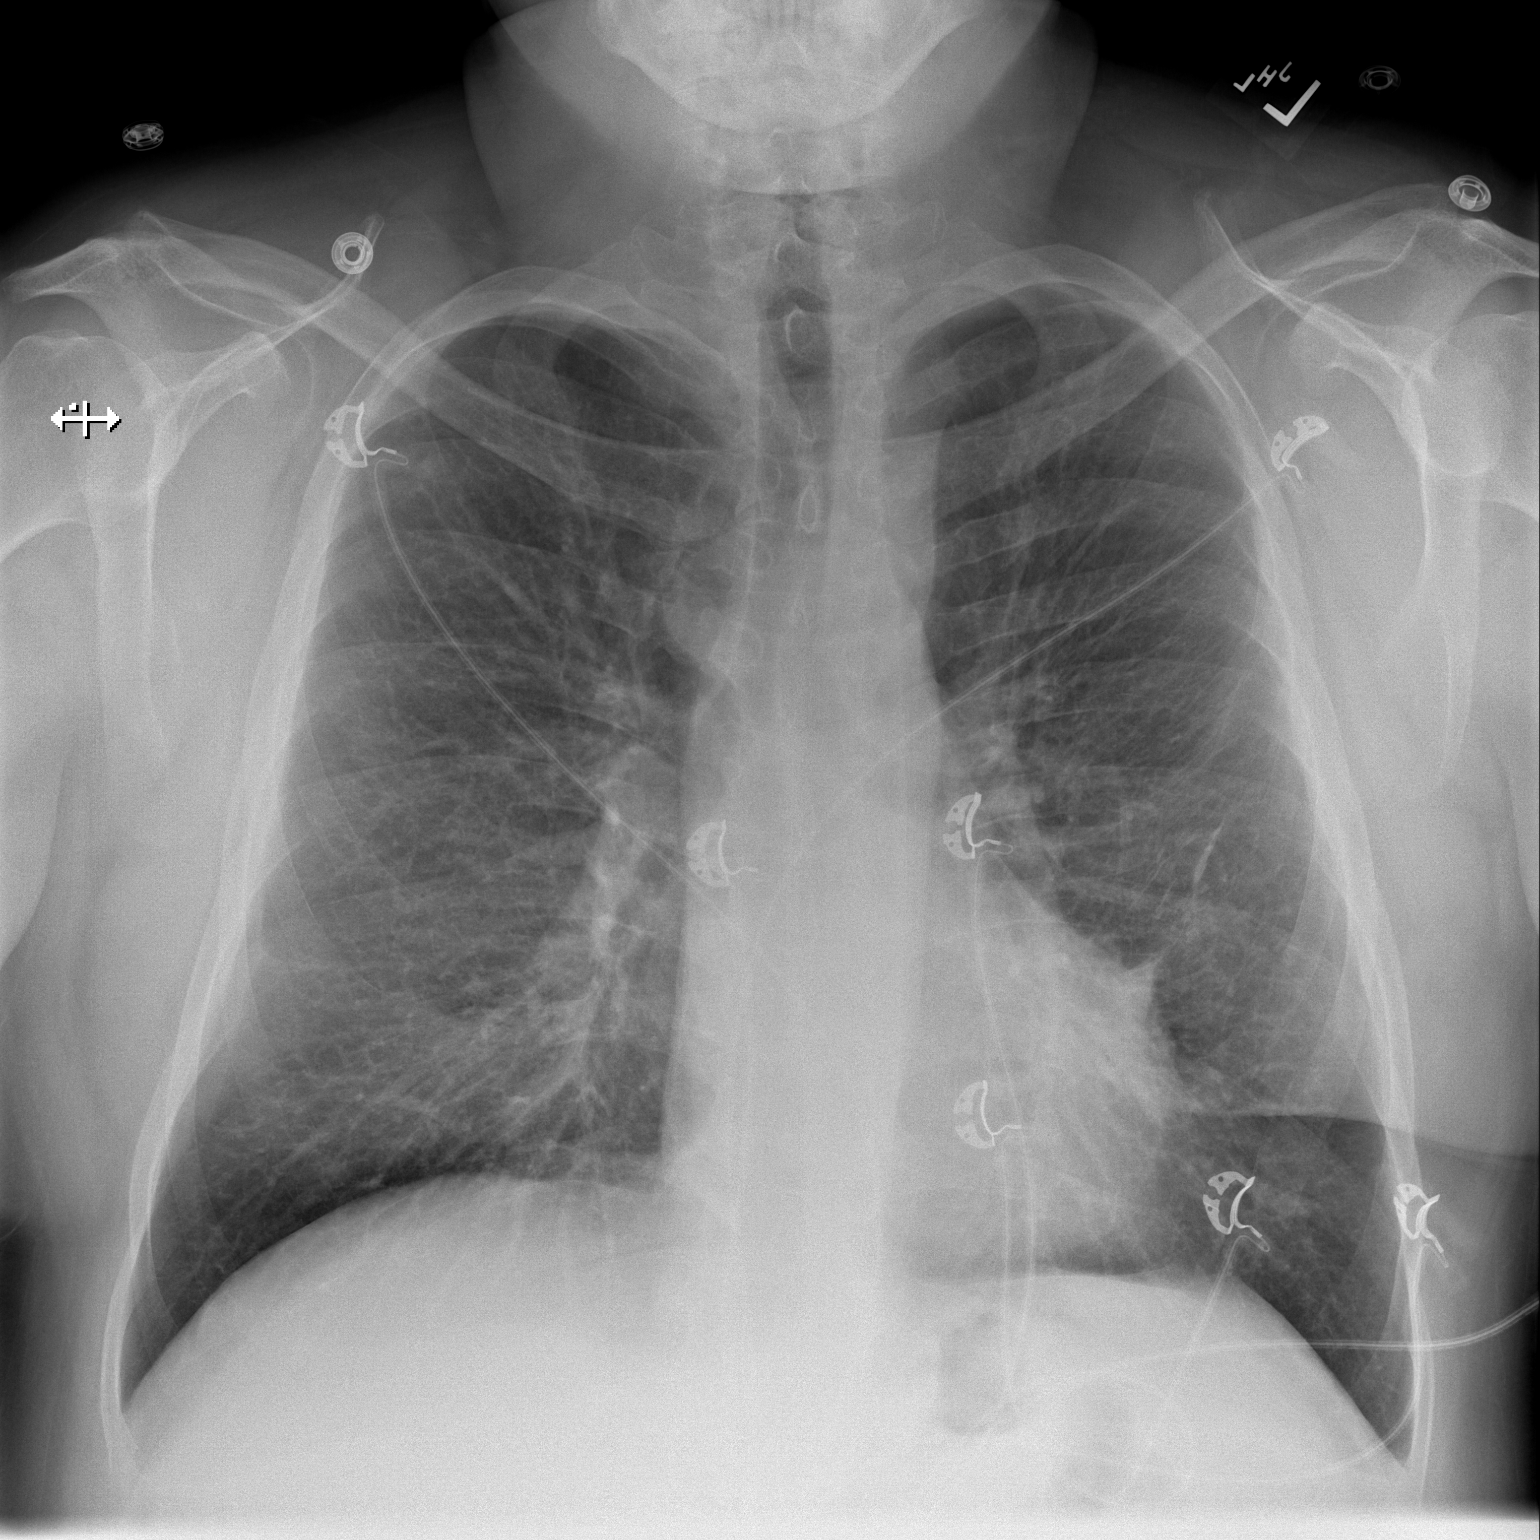

[w chest lat]
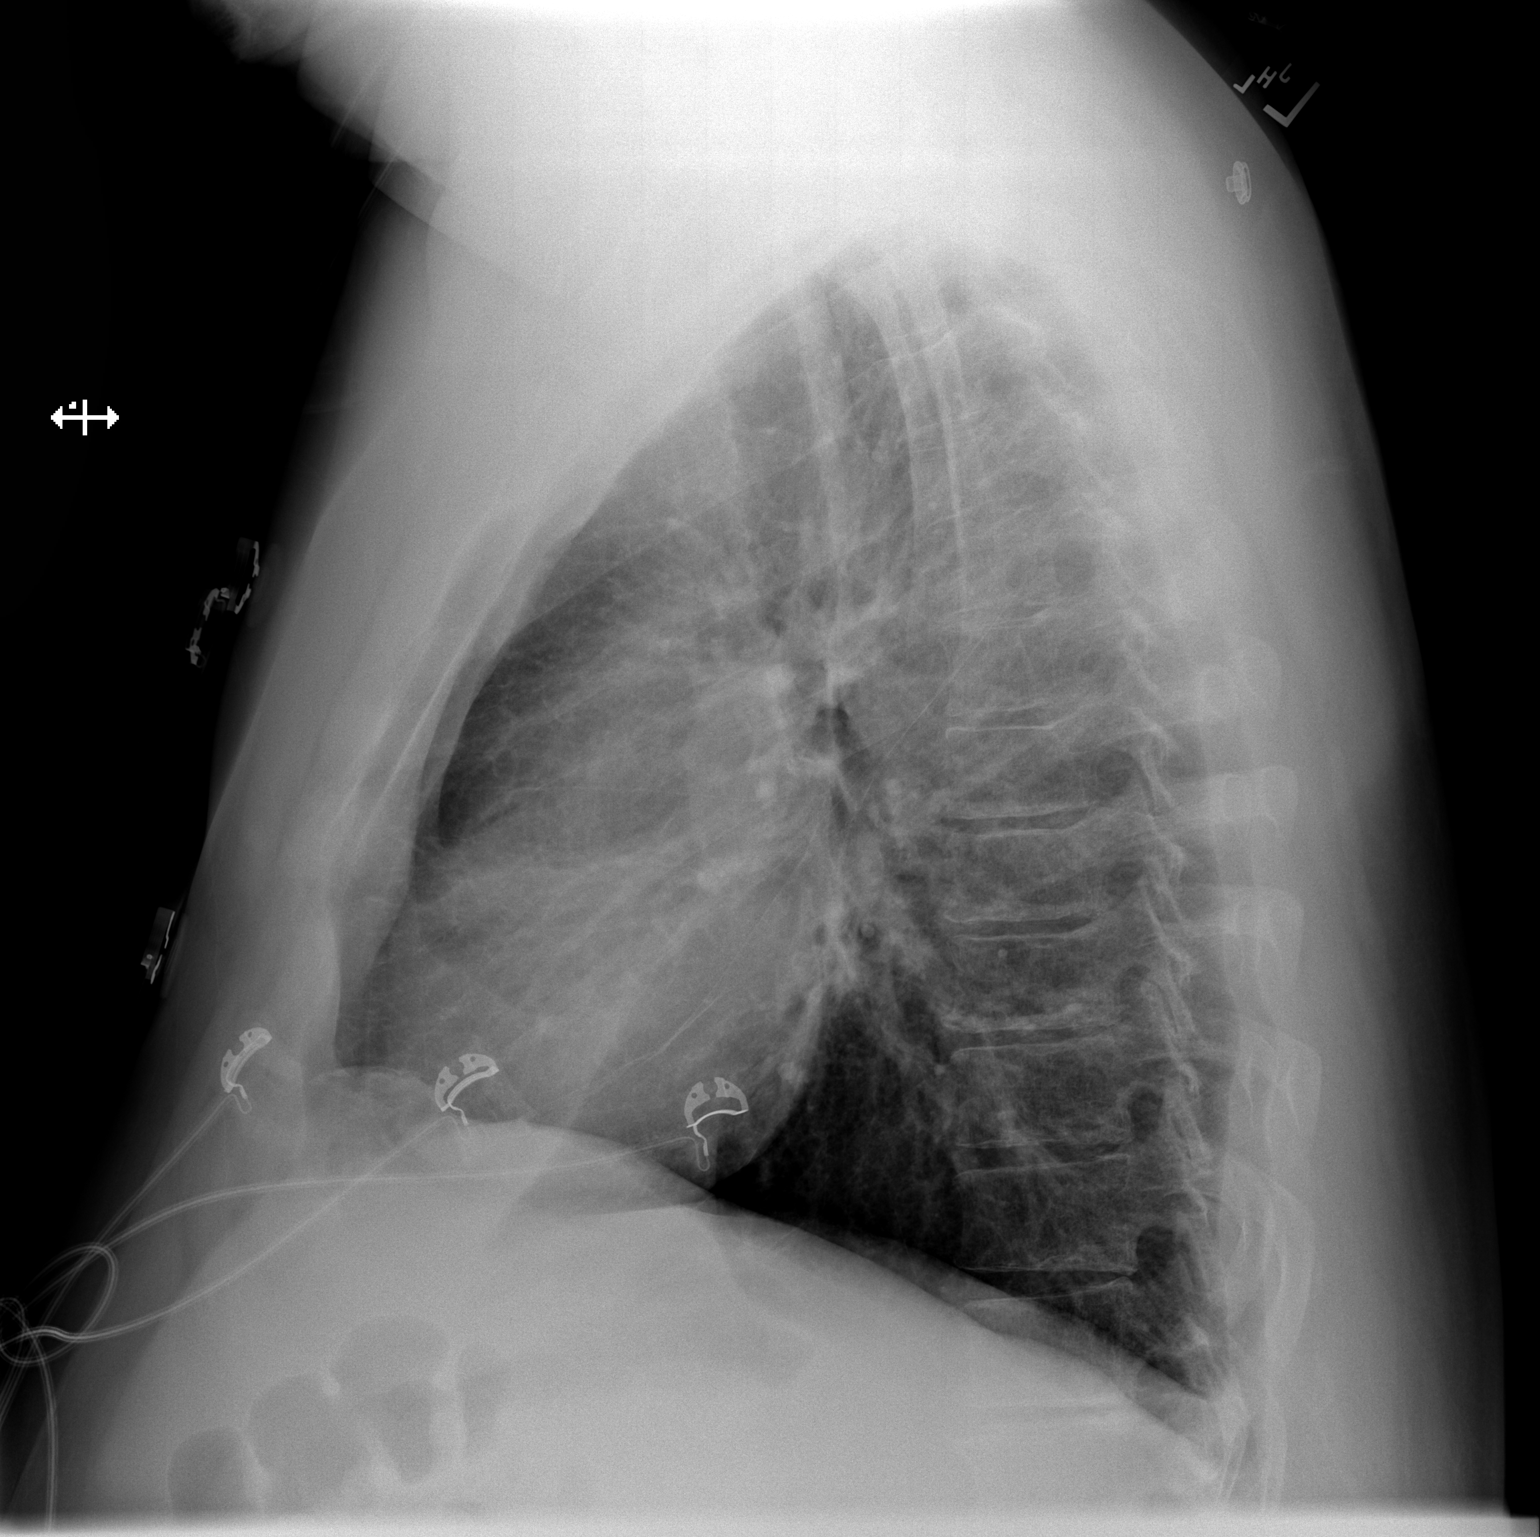

[2 of 2 positions shown; findings below may reference images not displayed]

FINDINGS: The heart size and mediastinal contours are within normal limits.
Stable left lingular scarring is noted. No acute pulmonary disease
is noted. The visualized skeletal structures are unremarkable.
IMPRESSION: No active cardiopulmonary disease.
